# Patient Record
Sex: Female | Born: 1935 | Race: White | Hispanic: No | Smoking: Never smoker
Health system: Southern US, Community
[De-identification: ages and names within clinical notes are randomized; demographics above are authoritative.]

## PROBLEM LIST (undated history)

## (undated) DIAGNOSIS — F0390 Unspecified dementia without behavioral disturbance: Secondary | ICD-10-CM

## (undated) DIAGNOSIS — E079 Disorder of thyroid, unspecified: Secondary | ICD-10-CM

## (undated) HISTORY — PX: CHOLECYSTECTOMY: SHX55

---

## 2014-03-24 DIAGNOSIS — F411 Generalized anxiety disorder: Secondary | ICD-10-CM | POA: Diagnosis not present

## 2014-03-24 DIAGNOSIS — F0150 Vascular dementia without behavioral disturbance: Secondary | ICD-10-CM | POA: Diagnosis not present

## 2014-03-31 DIAGNOSIS — E782 Mixed hyperlipidemia: Secondary | ICD-10-CM | POA: Diagnosis not present

## 2014-03-31 DIAGNOSIS — I482 Chronic atrial fibrillation: Secondary | ICD-10-CM | POA: Diagnosis not present

## 2014-03-31 DIAGNOSIS — K219 Gastro-esophageal reflux disease without esophagitis: Secondary | ICD-10-CM | POA: Diagnosis not present

## 2014-03-31 DIAGNOSIS — F0150 Vascular dementia without behavioral disturbance: Secondary | ICD-10-CM | POA: Diagnosis not present

## 2014-03-31 DIAGNOSIS — I10 Essential (primary) hypertension: Secondary | ICD-10-CM | POA: Diagnosis not present

## 2014-03-31 DIAGNOSIS — E038 Other specified hypothyroidism: Secondary | ICD-10-CM | POA: Diagnosis not present

## 2014-04-02 DIAGNOSIS — N390 Urinary tract infection, site not specified: Secondary | ICD-10-CM | POA: Diagnosis not present

## 2014-04-07 DIAGNOSIS — F0150 Vascular dementia without behavioral disturbance: Secondary | ICD-10-CM | POA: Diagnosis not present

## 2014-04-07 DIAGNOSIS — F411 Generalized anxiety disorder: Secondary | ICD-10-CM | POA: Diagnosis not present

## 2014-04-16 DIAGNOSIS — F0150 Vascular dementia without behavioral disturbance: Secondary | ICD-10-CM | POA: Diagnosis not present

## 2014-04-16 DIAGNOSIS — F411 Generalized anxiety disorder: Secondary | ICD-10-CM | POA: Diagnosis not present

## 2014-04-21 DIAGNOSIS — F0150 Vascular dementia without behavioral disturbance: Secondary | ICD-10-CM | POA: Diagnosis not present

## 2014-04-21 DIAGNOSIS — F411 Generalized anxiety disorder: Secondary | ICD-10-CM | POA: Diagnosis not present

## 2014-04-27 DIAGNOSIS — G301 Alzheimer's disease with late onset: Secondary | ICD-10-CM | POA: Diagnosis not present

## 2014-04-27 DIAGNOSIS — I10 Essential (primary) hypertension: Secondary | ICD-10-CM | POA: Diagnosis not present

## 2014-04-27 DIAGNOSIS — I4891 Unspecified atrial fibrillation: Secondary | ICD-10-CM | POA: Diagnosis not present

## 2014-04-27 DIAGNOSIS — E039 Hypothyroidism, unspecified: Secondary | ICD-10-CM | POA: Diagnosis not present

## 2014-05-05 DIAGNOSIS — R51 Headache: Secondary | ICD-10-CM | POA: Diagnosis not present

## 2014-05-05 DIAGNOSIS — S0990XA Unspecified injury of head, initial encounter: Secondary | ICD-10-CM | POA: Diagnosis not present

## 2014-05-06 ENCOUNTER — Encounter (HOSPITAL_COMMUNITY): Payer: Self-pay

## 2014-05-06 ENCOUNTER — Emergency Department (HOSPITAL_COMMUNITY)
Admission: EM | Admit: 2014-05-06 | Discharge: 2014-05-06 | Disposition: A | Discharge status: Home / Self Care | Payer: Medicare Other | Attending: Emergency Medicine | Admitting: Emergency Medicine

## 2014-05-06 ENCOUNTER — Emergency Department (HOSPITAL_COMMUNITY): Payer: Medicare Other

## 2014-05-06 DIAGNOSIS — Y92128 Other place in nursing home as the place of occurrence of the external cause: Secondary | ICD-10-CM | POA: Diagnosis not present

## 2014-05-06 DIAGNOSIS — S299XXA Unspecified injury of thorax, initial encounter: Secondary | ICD-10-CM | POA: Diagnosis not present

## 2014-05-06 DIAGNOSIS — W01198A Fall on same level from slipping, tripping and stumbling with subsequent striking against other object, initial encounter: Secondary | ICD-10-CM | POA: Diagnosis not present

## 2014-05-06 DIAGNOSIS — F0390 Unspecified dementia without behavioral disturbance: Secondary | ICD-10-CM | POA: Insufficient documentation

## 2014-05-06 DIAGNOSIS — Y998 Other external cause status: Secondary | ICD-10-CM | POA: Diagnosis not present

## 2014-05-06 DIAGNOSIS — B9689 Other specified bacterial agents as the cause of diseases classified elsewhere: Secondary | ICD-10-CM | POA: Diagnosis not present

## 2014-05-06 DIAGNOSIS — Y9389 Activity, other specified: Secondary | ICD-10-CM | POA: Diagnosis not present

## 2014-05-06 DIAGNOSIS — R259 Unspecified abnormal involuntary movements: Secondary | ICD-10-CM | POA: Diagnosis not present

## 2014-05-06 DIAGNOSIS — S199XXA Unspecified injury of neck, initial encounter: Secondary | ICD-10-CM | POA: Diagnosis not present

## 2014-05-06 DIAGNOSIS — N390 Urinary tract infection, site not specified: Secondary | ICD-10-CM | POA: Diagnosis not present

## 2014-05-06 DIAGNOSIS — S0083XA Contusion of other part of head, initial encounter: Secondary | ICD-10-CM | POA: Diagnosis not present

## 2014-05-06 DIAGNOSIS — R55 Syncope and collapse: Secondary | ICD-10-CM | POA: Diagnosis present

## 2014-05-06 DIAGNOSIS — W19XXXA Unspecified fall, initial encounter: Secondary | ICD-10-CM

## 2014-05-06 DIAGNOSIS — S0003XA Contusion of scalp, initial encounter: Secondary | ICD-10-CM | POA: Diagnosis not present

## 2014-05-06 HISTORY — DX: Unspecified dementia, unspecified severity, without behavioral disturbance, psychotic disturbance, mood disturbance, and anxiety: F03.90

## 2014-05-06 HISTORY — DX: Disorder of thyroid, unspecified: E07.9

## 2014-05-06 LAB — URINALYSIS, ROUTINE W REFLEX MICROSCOPIC
Bilirubin Urine: NEGATIVE
Glucose, UA: NEGATIVE mg/dL
Hgb urine dipstick: NEGATIVE
Ketones, ur: NEGATIVE mg/dL
NITRITE: NEGATIVE
PROTEIN: NEGATIVE mg/dL
SPECIFIC GRAVITY, URINE: 1.009 (ref 1.005–1.030)
UROBILINOGEN UA: 0.2 mg/dL (ref 0.0–1.0)
pH: 5 (ref 5.0–8.0)

## 2014-05-06 LAB — BASIC METABOLIC PANEL
Anion gap: 9 (ref 5–15)
BUN: 13 mg/dL (ref 6–23)
CALCIUM: 9.3 mg/dL (ref 8.4–10.5)
CO2: 26 mmol/L (ref 19–32)
Chloride: 105 mmol/L (ref 96–112)
Creatinine, Ser: 0.89 mg/dL (ref 0.50–1.10)
GFR calc Af Amer: 66 mL/min — ABNORMAL LOW (ref >90)
GFR, EST NON AFRICAN AMERICAN: 57 mL/min — AB (ref >90)
GLUCOSE: 114 mg/dL — AB (ref 70–99)
Potassium: 3.9 mmol/L (ref 3.5–5.1)
SODIUM: 140 mmol/L (ref 135–145)

## 2014-05-06 LAB — CBC
HEMATOCRIT: 42.1 % (ref 36.0–46.0)
HEMOGLOBIN: 13.8 g/dL (ref 12.0–15.0)
MCH: 30.5 pg (ref 26.0–34.0)
MCHC: 32.8 g/dL (ref 30.0–36.0)
MCV: 92.9 fL (ref 78.0–100.0)
Platelets: 260 10*3/uL (ref 150–400)
RBC: 4.53 MIL/uL (ref 3.87–5.11)
RDW: 13.3 % (ref 11.5–15.5)
WBC: 7.1 10*3/uL (ref 4.0–10.5)

## 2014-05-06 LAB — CBG MONITORING, ED: Glucose-Capillary: 102 mg/dL — ABNORMAL HIGH (ref 70–99)

## 2014-05-06 LAB — URINE MICROSCOPIC-ADD ON

## 2014-05-06 LAB — PROTIME-INR
INR: 1.18 (ref 0.00–1.49)
Prothrombin Time: 15.1 seconds (ref 11.6–15.2)

## 2014-05-06 MED ORDER — CEPHALEXIN 250 MG PO CAPS
500.0000 mg | ORAL_CAPSULE | Freq: Once | ORAL | Status: AC
  Administered 2014-05-06: 500 mg via ORAL
  Filled 2014-05-06: qty 2

## 2014-05-06 MED ORDER — CEPHALEXIN 500 MG PO CAPS: 500.0000 mg | ORAL_CAPSULE | Freq: Two times a day (BID) | ORAL | Status: DC

## 2014-05-06 NOTE — Discharge Instructions (Signed)
Fall Prevention and Home Safety Ruth Bartlett, your CT scan did not show any significant injury. Your urine does show an infection, take antibiotics as prescribed. Follow-up with your primary care physician within 3 days for continued management. If symptoms worsen come back to emergency department immediately. Thank you. Falls cause injuries and can affect all age groups. It is possible to prevent falls.  HOW TO PREVENT FALLS  Wear shoes with rubber soles that do not have an opening for your toes.  Keep the inside and outside of your house well lit.  Use night lights throughout your home.  Remove clutter from floors.  Clean up floor spills.  Remove throw rugs or fasten them to the floor with carpet tape.  Do not place electrical cords across pathways.  Put grab bars by your tub, shower, and toilet. Do not use towel bars as grab bars.  Put handrails on both sides of the stairway. Fix loose handrails.  Do not climb on stools or stepladders, if possible.  Do not wax your floors.  Repair uneven or unsafe sidewalks, walkways, or stairs.  Keep items you use a lot within reach.  Be aware of pets.  Keep emergency numbers next to the telephone.  Put smoke detectors in your home and near bedrooms. Ask your doctor what other things you can do to prevent falls. Document Released: 12/31/2008 Document Revised: 09/05/2011 Document Reviewed: 06/06/2011 [facility] Patient Information 2015 [place], [place]. This information is not intended to replace advice given to you by your health care provider. Make sure you discuss any questions you have with your health care provider. Urinary Tract Infection A urinary tract infection (UTI) can occur any place along the urinary tract. The tract includes the kidneys, ureters, bladder, and urethra. A type of germ called bacteria often causes a UTI. UTIs are often helped with antibiotic medicine.  HOME CARE   If given, take antibiotics as told by your doctor.  Finish them even if you start to feel better.  Drink enough fluids to keep your pee (urine) clear or pale yellow.  Avoid tea, drinks with caffeine, and bubbly (carbonated) drinks.  Pee often. Avoid holding your pee in for a long time.  Pee before and after having sex (intercourse).  Wipe from front to back after you poop (bowel movement) if you are a woman. Use each tissue only once. GET HELP RIGHT AWAY IF:   You have back pain.  You have lower belly (abdominal) pain.  You have chills.  You feel sick to your stomach (nauseous).  You throw up (vomit).  Your burning or discomfort with peeing does not go away.  You have a fever.  Your symptoms are not better in 3 days. MAKE SURE YOU:   Understand these instructions.  Will watch your condition.  Will get help right away if you are not doing well or get worse. Document Released: 08/23/2007 Document Revised: 11/29/2011 Document Reviewed: 10/05/2011 [facility] Patient Information 2015 [place], [place]. This information is not intended to replace advice given to you by your health care provider. Make sure you discuss any questions you have with your health care provider.

## 2014-05-06 NOTE — ED Notes (Signed)
Phlebotomy at the bedside  

## 2014-05-06 NOTE — ED Notes (Signed)
Per EMS[place] [facility] [place]. Patient has dementia. Found sitting in chair, small hematoma noted in back of head. Doesn't recall how she obtained injury. VS with ems: bp 140/80, p 70, rr 18, cbg 103. Daughter present at the bedside.

## 2014-05-06 NOTE — ED Notes (Signed)
Dr. Oni at the bedside.  

## 2014-05-06 NOTE — ED Provider Notes (Signed)
CSN: [id number]     Arrival date & time 05/06/14  0002 History  This chart was scribed for Ruth CrumbleAdeleke Anandi Abramo, MD by Richarda Overlieichard Holland, ED Scribe. This patient was seen in room D30C/D30C and the patient's care was started 12:09 AM.     Chief Complaint  Patient presents with  . Fall   Level 5 Caveat - Dementia   HPI HPI Comments: Ruth Bartlett is a 81 y.o. female with a history of dementia who presents to the Emergency Department complaining of multiple falls that occurred at 10PM and 11PM. Her daughter in law states that the fall was unwitnessed and is unsure if pt hit her head. She states that pt is on ativan and was given a total of 1.0mg  earlier tonight after 2 doses of 0.5mg . She states that pt is on blood thinning medication. Pt reports no alleviating or modifying factors at this time. Daughter in law reports NKDA.   Past Medical History  Diagnosis Date  . Dementia    No past surgical history on file. No family history on file. History  Substance Use Topics  . Smoking status: Not on file  . Smokeless tobacco: Not on file  . Alcohol Use: Not on file   OB History    No data available     Review of Systems  Unable to perform ROS: Dementia  Musculoskeletal: Positive for myalgias.  All other systems reviewed and are negative.  Allergies  Review of patient's allergies indicates not on file.  Home Medications   Prior to Admission medications   Not on File   BP 148/67 mmHg  Pulse 56  Temp(Src) 97.8 F (36.6 C) (Oral)  Resp 18  SpO2 97% Physical Exam  Constitutional: She is oriented to person, place, and time. She appears well-developed and well-nourished. No distress.  HENT:  Head: Normocephalic and atraumatic.  Nose: Nose normal.  Mouth/Throat: Oropharynx is clear and moist. No oropharyngeal exudate.  Eyes: Conjunctivae and EOM are normal. Pupils are equal, round, and reactive to light. No scleral icterus.  Neck: Normal range of motion. Neck supple. No JVD present. No  tracheal deviation present. No thyromegaly present.  Cardiovascular: Normal rate, regular rhythm and normal heart sounds.  Exam reveals no gallop and no friction rub.   No murmur heard. Pulmonary/Chest: Effort normal and breath sounds normal. No respiratory distress. She has no wheezes. She exhibits no tenderness.  Abdominal: Soft. Bowel sounds are normal. She exhibits no distension and no mass. There is no tenderness. There is no rebound and no guarding.  Musculoskeletal: Normal range of motion. She exhibits no edema or tenderness.  3cm circumferential soft tissue hematoma to her occiput.   Lymphadenopathy:    She has no cervical adenopathy.  Neurological: She is alert and oriented to person, place, and time. No cranial nerve deficit. She exhibits normal muscle tone.  Normal strength and sensation in all extremities. Able to follow commands.   Skin: Skin is warm and dry. No rash noted. No erythema. No pallor.  Nursing note and vitals reviewed.   ED Course  Procedures   DIAGNOSTIC STUDIES: Oxygen Saturation is 97% on RA, normal by my interpretation.    COORDINATION OF CARE: 12:14 AM Discussed treatment plan with pt at bedside and pt agreed to plan.   Labs Review Labs Reviewed  BASIC METABOLIC PANEL - Abnormal; Notable for the following:    Glucose, Bld 114 (*)    GFR calc non Af Amer 57 (*)    GFR calc  Af Amer 66 (*)    All other components within normal limits  URINALYSIS, ROUTINE W REFLEX MICROSCOPIC - Abnormal; Notable for the following:    APPearance CLOUDY (*)    Leukocytes, UA LARGE (*)    All other components within normal limits  URINE MICROSCOPIC-ADD ON - Abnormal; Notable for the following:    Squamous Epithelial / LPF FEW (*)    Bacteria, UA MANY (*)    All other components within normal limits  CBG MONITORING, ED - Abnormal; Notable for the following:    Glucose-Capillary 102 (*)    All other components within normal limits  CBC  PROTIME-INR    Imaging  Review Dg Chest 2 View  05/06/2014   CLINICAL DATA:  Status post fall.  Shortness of breath.  EXAM: CHEST  2 VIEW  COMPARISON:  None.  FINDINGS: The lungs are clear. Mild cardiomegaly is seen. The aorta is ectatic. No pneumothorax or pleural effusion. No focal bony abnormality.  IMPRESSION: No acute disease.  Cardiomegaly and ectatic aorta.   Electronically Signed   By: Drusilla Kanner M.D.   On: 05/06/2014 01:09   Ct Head Wo Contrast  05/06/2014   CLINICAL DATA:  Fall.  Hematoma on the posterior aspect of the head.  EXAM: CT HEAD WITHOUT CONTRAST  CT CERVICAL SPINE WITHOUT CONTRAST  TECHNIQUE: Multidetector CT imaging of the head and cervical spine was performed following the standard protocol without intravenous contrast. Multiplanar CT image reconstructions of the cervical spine were also generated.  COMPARISON:  None.  FINDINGS: CT HEAD FINDINGS  Cortical atrophy and chronic microvascular ischemic change are identified. No acute intracranial abnormality including hemorrhage, infarct, mass lesion, mass effect, midline shift or abnormal extra-axial fluid collection is seen. The calvarium is intact. Scalp contusion posteriorly near the vertex is noted.  CT CERVICAL SPINE FINDINGS  No fracture or malalignment of the cervical spine is identified. Cervical spondylosis is seen and most notable at C4-5 and C5-6. Lung apices demonstrate scar, more notable on the right.  IMPRESSION: Scalp contusion without underlying fracture or acute intracranial abnormality.  No acute abnormality cervical spine.  Atrophy and chronic microvascular ischemic change  Cervical spondylosis.   Electronically Signed   By: Drusilla Kanner M.D.   On: 05/06/2014 01:06   Ct Cervical Spine Wo Contrast  05/06/2014   CLINICAL DATA:  Fall.  Hematoma on the posterior aspect of the head.  EXAM: CT HEAD WITHOUT CONTRAST  CT CERVICAL SPINE WITHOUT CONTRAST  TECHNIQUE: Multidetector CT imaging of the head and cervical spine was performed following  the standard protocol without intravenous contrast. Multiplanar CT image reconstructions of the cervical spine were also generated.  COMPARISON:  None.  FINDINGS: CT HEAD FINDINGS  Cortical atrophy and chronic microvascular ischemic change are identified. No acute intracranial abnormality including hemorrhage, infarct, mass lesion, mass effect, midline shift or abnormal extra-axial fluid collection is seen. The calvarium is intact. Scalp contusion posteriorly near the vertex is noted.  CT CERVICAL SPINE FINDINGS  No fracture or malalignment of the cervical spine is identified. Cervical spondylosis is seen and most notable at C4-5 and C5-6. Lung apices demonstrate scar, more notable on the right.  IMPRESSION: Scalp contusion without underlying fracture or acute intracranial abnormality.  No acute abnormality cervical spine.  Atrophy and chronic microvascular ischemic change  Cervical spondylosis.   Electronically Signed   By: Drusilla Kanner M.D.   On: 05/06/2014 01:06     EKG Interpretation None  MDM   Final diagnoses:  None   Patient presents emergency department after falling at her facility. By history this is likely due to being given too much Ativan tonight. Patient is on a blood thinner, will obtain CT scan of head for evaluation. No break in skin, no tetanus indicated. There is soft tissue swelling seen in the back of her head. Also will get laboratory studies and infections workup to ensure there is no other cause of her fall.  CT scan does not show any significant injury. Urinalysis does show an infection. Patient is otherwise hemodynamically stable. First dose Anaprox was given in emergency department, she'll be sent home with a prescription for complete course. Patient's vital signs were within her normal limits and she is safe for transfer back to her facility.   I personally performed the services described in this documentation, which was scribed in my presence. The recorded  information has been reviewed and is accurate.      Ruth Crumble, MD 05/06/14 [phone]

## 2014-05-06 NOTE — ED Notes (Signed)
[facility]

## 2014-05-13 DIAGNOSIS — N390 Urinary tract infection, site not specified: Secondary | ICD-10-CM | POA: Diagnosis not present

## 2014-05-14 DIAGNOSIS — Z9181 History of falling: Secondary | ICD-10-CM | POA: Diagnosis not present

## 2014-05-14 DIAGNOSIS — N390 Urinary tract infection, site not specified: Secondary | ICD-10-CM | POA: Diagnosis not present

## 2014-05-14 DIAGNOSIS — F0390 Unspecified dementia without behavioral disturbance: Secondary | ICD-10-CM | POA: Diagnosis not present

## 2014-05-14 DIAGNOSIS — R2689 Other abnormalities of gait and mobility: Secondary | ICD-10-CM | POA: Diagnosis not present

## 2014-05-18 DIAGNOSIS — F0390 Unspecified dementia without behavioral disturbance: Secondary | ICD-10-CM | POA: Diagnosis not present

## 2014-05-18 DIAGNOSIS — R2689 Other abnormalities of gait and mobility: Secondary | ICD-10-CM | POA: Diagnosis not present

## 2014-05-18 DIAGNOSIS — N390 Urinary tract infection, site not specified: Secondary | ICD-10-CM | POA: Diagnosis not present

## 2014-05-18 DIAGNOSIS — Z9181 History of falling: Secondary | ICD-10-CM | POA: Diagnosis not present

## 2014-05-21 DIAGNOSIS — Z9181 History of falling: Secondary | ICD-10-CM | POA: Diagnosis not present

## 2014-05-21 DIAGNOSIS — N390 Urinary tract infection, site not specified: Secondary | ICD-10-CM | POA: Diagnosis not present

## 2014-05-21 DIAGNOSIS — R2689 Other abnormalities of gait and mobility: Secondary | ICD-10-CM | POA: Diagnosis not present

## 2014-05-21 DIAGNOSIS — F0390 Unspecified dementia without behavioral disturbance: Secondary | ICD-10-CM | POA: Diagnosis not present

## 2014-05-25 DIAGNOSIS — R2689 Other abnormalities of gait and mobility: Secondary | ICD-10-CM | POA: Diagnosis not present

## 2014-05-25 DIAGNOSIS — Z9181 History of falling: Secondary | ICD-10-CM | POA: Diagnosis not present

## 2014-05-25 DIAGNOSIS — N390 Urinary tract infection, site not specified: Secondary | ICD-10-CM | POA: Diagnosis not present

## 2014-05-25 DIAGNOSIS — F0390 Unspecified dementia without behavioral disturbance: Secondary | ICD-10-CM | POA: Diagnosis not present

## 2014-05-26 DIAGNOSIS — N390 Urinary tract infection, site not specified: Secondary | ICD-10-CM | POA: Diagnosis not present

## 2014-05-28 DIAGNOSIS — Z9181 History of falling: Secondary | ICD-10-CM | POA: Diagnosis not present

## 2014-05-28 DIAGNOSIS — F0390 Unspecified dementia without behavioral disturbance: Secondary | ICD-10-CM | POA: Diagnosis not present

## 2014-05-28 DIAGNOSIS — R2689 Other abnormalities of gait and mobility: Secondary | ICD-10-CM | POA: Diagnosis not present

## 2014-05-28 DIAGNOSIS — N390 Urinary tract infection, site not specified: Secondary | ICD-10-CM | POA: Diagnosis not present

## 2014-06-01 DIAGNOSIS — R2689 Other abnormalities of gait and mobility: Secondary | ICD-10-CM | POA: Diagnosis not present

## 2014-06-01 DIAGNOSIS — Z9181 History of falling: Secondary | ICD-10-CM | POA: Diagnosis not present

## 2014-06-01 DIAGNOSIS — N390 Urinary tract infection, site not specified: Secondary | ICD-10-CM | POA: Diagnosis not present

## 2014-06-01 DIAGNOSIS — F0390 Unspecified dementia without behavioral disturbance: Secondary | ICD-10-CM | POA: Diagnosis not present

## 2014-06-04 DIAGNOSIS — R2689 Other abnormalities of gait and mobility: Secondary | ICD-10-CM | POA: Diagnosis not present

## 2014-06-04 DIAGNOSIS — F0390 Unspecified dementia without behavioral disturbance: Secondary | ICD-10-CM | POA: Diagnosis not present

## 2014-06-04 DIAGNOSIS — N390 Urinary tract infection, site not specified: Secondary | ICD-10-CM | POA: Diagnosis not present

## 2014-06-04 DIAGNOSIS — Z9181 History of falling: Secondary | ICD-10-CM | POA: Diagnosis not present

## 2014-06-09 DIAGNOSIS — N390 Urinary tract infection, site not specified: Secondary | ICD-10-CM | POA: Diagnosis not present

## 2014-06-09 DIAGNOSIS — Z9181 History of falling: Secondary | ICD-10-CM | POA: Diagnosis not present

## 2014-06-09 DIAGNOSIS — R2689 Other abnormalities of gait and mobility: Secondary | ICD-10-CM | POA: Diagnosis not present

## 2014-06-09 DIAGNOSIS — F0390 Unspecified dementia without behavioral disturbance: Secondary | ICD-10-CM | POA: Diagnosis not present

## 2014-06-11 DIAGNOSIS — F0390 Unspecified dementia without behavioral disturbance: Secondary | ICD-10-CM | POA: Diagnosis not present

## 2014-06-11 DIAGNOSIS — R2689 Other abnormalities of gait and mobility: Secondary | ICD-10-CM | POA: Diagnosis not present

## 2014-06-11 DIAGNOSIS — Z9181 History of falling: Secondary | ICD-10-CM | POA: Diagnosis not present

## 2014-06-11 DIAGNOSIS — N390 Urinary tract infection, site not specified: Secondary | ICD-10-CM | POA: Diagnosis not present

## 2014-06-15 DIAGNOSIS — R2689 Other abnormalities of gait and mobility: Secondary | ICD-10-CM | POA: Diagnosis not present

## 2014-06-15 DIAGNOSIS — Z9181 History of falling: Secondary | ICD-10-CM | POA: Diagnosis not present

## 2014-06-15 DIAGNOSIS — F0390 Unspecified dementia without behavioral disturbance: Secondary | ICD-10-CM | POA: Diagnosis not present

## 2014-06-15 DIAGNOSIS — N390 Urinary tract infection, site not specified: Secondary | ICD-10-CM | POA: Diagnosis not present

## 2014-06-16 DIAGNOSIS — N390 Urinary tract infection, site not specified: Secondary | ICD-10-CM | POA: Diagnosis not present

## 2014-06-16 DIAGNOSIS — R2689 Other abnormalities of gait and mobility: Secondary | ICD-10-CM | POA: Diagnosis not present

## 2014-06-16 DIAGNOSIS — Z9181 History of falling: Secondary | ICD-10-CM | POA: Diagnosis not present

## 2014-06-16 DIAGNOSIS — F0390 Unspecified dementia without behavioral disturbance: Secondary | ICD-10-CM | POA: Diagnosis not present

## 2014-06-17 ENCOUNTER — Emergency Department (HOSPITAL_COMMUNITY)
Admission: EM | Admit: 2014-06-17 | Discharge: 2014-06-17 | Disposition: A | Discharge status: Home / Self Care | Payer: Medicare Other | Attending: Emergency Medicine | Admitting: Emergency Medicine

## 2014-06-17 ENCOUNTER — Encounter (HOSPITAL_COMMUNITY): Payer: Self-pay | Admitting: *Deleted

## 2014-06-17 ENCOUNTER — Emergency Department (HOSPITAL_COMMUNITY): Payer: Medicare Other

## 2014-06-17 ENCOUNTER — Other Ambulatory Visit (HOSPITAL_COMMUNITY): Payer: Medicare Other

## 2014-06-17 DIAGNOSIS — Y998 Other external cause status: Secondary | ICD-10-CM | POA: Diagnosis not present

## 2014-06-17 DIAGNOSIS — Y9289 Other specified places as the place of occurrence of the external cause: Secondary | ICD-10-CM | POA: Insufficient documentation

## 2014-06-17 DIAGNOSIS — W010XXA Fall on same level from slipping, tripping and stumbling without subsequent striking against object, initial encounter: Secondary | ICD-10-CM | POA: Insufficient documentation

## 2014-06-17 DIAGNOSIS — Z792 Long term (current) use of antibiotics: Secondary | ICD-10-CM | POA: Insufficient documentation

## 2014-06-17 DIAGNOSIS — Z7901 Long term (current) use of anticoagulants: Secondary | ICD-10-CM | POA: Insufficient documentation

## 2014-06-17 DIAGNOSIS — E079 Disorder of thyroid, unspecified: Secondary | ICD-10-CM | POA: Insufficient documentation

## 2014-06-17 DIAGNOSIS — T148 Other injury of unspecified body region: Secondary | ICD-10-CM | POA: Diagnosis not present

## 2014-06-17 DIAGNOSIS — S0083XA Contusion of other part of head, initial encounter: Secondary | ICD-10-CM | POA: Diagnosis not present

## 2014-06-17 DIAGNOSIS — F0390 Unspecified dementia without behavioral disturbance: Secondary | ICD-10-CM | POA: Diagnosis not present

## 2014-06-17 DIAGNOSIS — M25569 Pain in unspecified knee: Secondary | ICD-10-CM | POA: Diagnosis not present

## 2014-06-17 DIAGNOSIS — S299XXA Unspecified injury of thorax, initial encounter: Secondary | ICD-10-CM | POA: Diagnosis not present

## 2014-06-17 DIAGNOSIS — S199XXA Unspecified injury of neck, initial encounter: Secondary | ICD-10-CM | POA: Diagnosis not present

## 2014-06-17 DIAGNOSIS — S0990XA Unspecified injury of head, initial encounter: Secondary | ICD-10-CM | POA: Diagnosis present

## 2014-06-17 DIAGNOSIS — W19XXXA Unspecified fall, initial encounter: Secondary | ICD-10-CM

## 2014-06-17 DIAGNOSIS — Y9389 Activity, other specified: Secondary | ICD-10-CM | POA: Diagnosis not present

## 2014-06-17 DIAGNOSIS — Z79899 Other long term (current) drug therapy: Secondary | ICD-10-CM | POA: Diagnosis not present

## 2014-06-17 LAB — CBC WITH DIFFERENTIAL/PLATELET
BASOS ABS: 0 10*3/uL (ref 0.0–0.1)
BASOS PCT: 0 % (ref 0–1)
EOS PCT: 3 % (ref 0–5)
Eosinophils Absolute: 0.2 10*3/uL (ref 0.0–0.7)
HEMATOCRIT: 41.6 % (ref 36.0–46.0)
Hemoglobin: 13.8 g/dL (ref 12.0–15.0)
Lymphocytes Relative: 21 % (ref 12–46)
Lymphs Abs: 1.9 10*3/uL (ref 0.7–4.0)
MCH: 31.1 pg (ref 26.0–34.0)
MCHC: 33.2 g/dL (ref 30.0–36.0)
MCV: 93.7 fL (ref 78.0–100.0)
MONO ABS: 0.6 10*3/uL (ref 0.1–1.0)
Monocytes Relative: 7 % (ref 3–12)
NEUTROS ABS: 6.2 10*3/uL (ref 1.7–7.7)
Neutrophils Relative %: 69 % (ref 43–77)
Platelets: 287 10*3/uL (ref 150–400)
RBC: 4.44 MIL/uL (ref 3.87–5.11)
RDW: 13.7 % (ref 11.5–15.5)
WBC: 8.9 10*3/uL (ref 4.0–10.5)

## 2014-06-17 LAB — BASIC METABOLIC PANEL
Anion gap: 8 (ref 5–15)
BUN: 18 mg/dL (ref 6–23)
CO2: 27 mmol/L (ref 19–32)
CREATININE: 0.84 mg/dL (ref 0.50–1.10)
Calcium: 9.4 mg/dL (ref 8.4–10.5)
Chloride: 105 mmol/L (ref 96–112)
GFR calc Af Amer: 70 mL/min — ABNORMAL LOW (ref >90)
GFR, EST NON AFRICAN AMERICAN: 60 mL/min — AB (ref >90)
Glucose, Bld: 107 mg/dL — ABNORMAL HIGH (ref 70–99)
POTASSIUM: 4 mmol/L (ref 3.5–5.1)
SODIUM: 140 mmol/L (ref 135–145)

## 2014-06-17 MED ORDER — KETOROLAC TROMETHAMINE 30 MG/ML IJ SOLN
15.0000 mg | Freq: Once | INTRAMUSCULAR | Status: DC
  Filled 2014-06-17: qty 1

## 2014-06-17 NOTE — ED Notes (Signed)
Bed: WA13 Expected date:  Expected time:  Means of arrival:  Comments: EMS-fall 

## 2014-06-17 NOTE — ED Notes (Signed)
Per EMS-tripped and fell, witnessed by staff-right eye, right knee abrasion-did not loose consciousness-uncertain if patient is on blood thinners

## 2014-06-17 NOTE — ED Notes (Signed)
Daughter Sedonia SmallSusan Sabey, [place]PO[place] #[phone]

## 2014-06-17 NOTE — ED Provider Notes (Signed)
CSN: [id number]     Arrival date & time 06/17/14  1140 History   First MD Initiated Contact with Patient 06/17/14 1200     Chief Complaint  Patient presents with  . Fall     (Consider location/radiation/quality/duration/timing/severity/associated sxs/prior Treatment) Patient is a 81 y.o. female presenting with fall.  Fall This is a new problem. The current episode started 1 to 2 hours ago. The problem occurs constantly. The problem has not changed since onset.Pertinent negatives include no chest pain, no abdominal pain, no headaches and no shortness of breath. Nothing aggravates the symptoms. Nothing relieves the symptoms.    Past Medical History  Diagnosis Date  . Dementia   . Thyroid disease    Past Surgical History  Procedure Laterality Date  . Cholecystectomy     No family history on file. History  Substance Use Topics  . Smoking status: Never Smoker   . Smokeless tobacco: Not on file  . Alcohol Use: No   OB History    No data available     Review of Systems  Respiratory: Negative for shortness of breath.   Cardiovascular: Negative for chest pain.  Gastrointestinal: Negative for abdominal pain.  Neurological: Negative for headaches.  All other systems reviewed and are negative.     Allergies  Ace inhibitors and Depakote  Home Medications   Prior to Admission medications   Medication Sig Start Date End Date Taking? Authorizing Provider  alum & mag hydroxide-simeth (MAALOX/MYLANTA) 200-200-20 MG/5ML suspension Take 10 mLs by mouth 3 (three) times daily as needed for indigestion or heartburn.    Historical Provider, MD  apixaban (ELIQUIS) 5 MG TABS tablet Take 5 mg by mouth 2 (two) times daily.    Historical Provider, MD  bismuth subsalicylate (PEPTO BISMOL) 262 MG/15ML suspension Take 15 mLs by mouth every 6 (six) hours as needed for diarrhea or loose stools.    Historical Provider, MD  cephALEXin (KEFLEX) 500 MG capsule Take 1 capsule (500 mg total) by mouth  2 (two) times daily. 05/06/14   Tomasita Crumble, MD  docusate sodium (COLACE) 100 MG capsule Take 100 mg by mouth daily.    Historical Provider, MD  donepezil (ARICEPT) 10 MG tablet Take 10 mg by mouth at bedtime.    Historical Provider, MD  guaiFENesin (MUCINEX) 600 MG 12 hr tablet Take 600 mg by mouth 2 (two) times daily as needed.    Historical Provider, MD  latanoprost (XALATAN) 0.005 % ophthalmic solution Place 1 drop into both eyes at bedtime.    Historical Provider, MD  levothyroxine (SYNTHROID, LEVOTHROID) 50 MCG tablet Take 50 mcg by mouth daily before breakfast.    Historical Provider, MD  LORazepam (ATIVAN) 0.5 MG tablet Take 0.5 mg by mouth 2 (two) times daily.    Historical Provider, MD  LORazepam (ATIVAN) 0.5 MG tablet Take 0.5 mg by mouth every 6 (six) hours as needed for anxiety.    Historical Provider, MD  memantine (NAMENDA) 10 MG tablet Take 10 mg by mouth 2 (two) times daily.    Historical Provider, MD  metoprolol succinate (TOPROL-XL) 25 MG 24 hr tablet Take 25 mg by mouth daily.    Historical Provider, MD  mirtazapine (REMERON) 15 MG tablet Take 7.5 mg by mouth at bedtime.    Historical Provider, MD  Multiple Vitamins-Minerals (CEROVITE PO) Take 1 tablet by mouth daily.    Historical Provider, MD  ranitidine (ZANTAC) 150 MG tablet Take 150 mg by mouth 2 (two) times daily.  Historical Provider, MD  sertraline (ZOLOFT) 100 MG tablet Take 100 mg by mouth daily.    Historical Provider, MD   BP 135/65 mmHg  Pulse 65  Temp(Src) 98.9 F (37.2 C) (Oral)  Resp 16  SpO2 95% Physical Exam  Constitutional: She is oriented to person, place, and time. She appears well-developed and well-nourished.  HENT:  Head: Normocephalic.  Right Ear: External ear normal.  Left Ear: External ear normal.  R eye hematoma over brow  Eyes: Conjunctivae and EOM are normal. Pupils are equal, round, and reactive to light.  Neck: Normal range of motion. Neck supple.  Cardiovascular: Normal rate, regular  rhythm, normal heart sounds and intact distal pulses.   Pulmonary/Chest: Effort normal and breath sounds normal.  Abdominal: Soft. Bowel sounds are normal. There is no tenderness.  Musculoskeletal: Normal range of motion.  Neurological: She is alert and oriented to person, place, and time.  Skin: Skin is warm and dry.  Vitals reviewed.   ED Course  Procedures (including critical care time) Labs Review Labs Reviewed  BASIC METABOLIC PANEL - Abnormal; Notable for the following:    Glucose, Bld 107 (*)    GFR calc non Af Amer 60 (*)    GFR calc Af Amer 70 (*)    All other components within normal limits  CBC WITH DIFFERENTIAL/PLATELET    Imaging Review Dg Chest 2 View  06/17/2014   CLINICAL DATA:  Fall going up stairs, dementia  EXAM: CHEST  2 VIEW  COMPARISON:  05/06/2014  FINDINGS: Cardiomediastinal silhouette is stable. No acute infiltrate or pulmonary edema. Osteopenia and mild degenerative changes again noted. Again noted ecstatic thoracic aorta. Old fracture deformity of right humeral head.  IMPRESSION: No active cardiopulmonary disease.   Electronically Signed   By: Natasha MeadLiviu  Pop M.D.   On: 06/17/2014 13:51   Ct Head Wo Contrast  06/17/2014   CLINICAL DATA:  Fall with right frontal hematoma.  Dementia.  EXAM: CT HEAD WITHOUT CONTRAST  CT CERVICAL SPINE WITHOUT CONTRAST  TECHNIQUE: Multidetector CT imaging of the head and cervical spine was performed following the standard protocol without intravenous contrast. Multiplanar CT image reconstructions of the cervical spine were also generated.  COMPARISON:  05/06/2014  FINDINGS: On scout imaging there is deformation of the right humeral head, likely from remote fracture.  CT HEAD FINDINGS  Skull and Sinuses:Right forehead contusion without fracture or foreign body.  Orbits: No acute abnormality.  Brain: No evidence of acute infarction, hemorrhage, hydrocephalus, or mass lesion/mass effect. Generalized cortical atrophy.  CT CERVICAL SPINE  FINDINGS  No evidence of cervical spine fracture or traumatic malalignment. No gross cervical canal hematoma or prevertebral edema.  Patient is status post C4-5 and C5-6 discectomy and fusion or ankylosis. There is diffuse degenerative disc and facet disease with multi level bulky facet spurring.  Stable biapical lung opacity, left greater than right, which on coronal imaging has bandlike morphology suggesting scar.  Right thyroidectomy.  IMPRESSION: 1. Right forehead contusion without fracture. 2. No acute intracranial injury or cervical spine fracture. 3. Right humeral neck fracture which appears remote. Correlate for acute shoulder pain. 4. Scar like opacities in the apical lungs.   Electronically Signed   By: Marnee SpringJonathon  Watts M.D.   On: 06/17/2014 13:54   Ct Cervical Spine Wo Contrast  06/17/2014   CLINICAL DATA:  Fall with right frontal hematoma.  Dementia.  EXAM: CT HEAD WITHOUT CONTRAST  CT CERVICAL SPINE WITHOUT CONTRAST  TECHNIQUE: Multidetector CT imaging of the  head and cervical spine was performed following the standard protocol without intravenous contrast. Multiplanar CT image reconstructions of the cervical spine were also generated.  COMPARISON:  05/06/2014  FINDINGS: On scout imaging there is deformation of the right humeral head, likely from remote fracture.  CT HEAD FINDINGS  Skull and Sinuses:Right forehead contusion without fracture or foreign body.  Orbits: No acute abnormality.  Brain: No evidence of acute infarction, hemorrhage, hydrocephalus, or mass lesion/mass effect. Generalized cortical atrophy.  CT CERVICAL SPINE FINDINGS  No evidence of cervical spine fracture or traumatic malalignment. No gross cervical canal hematoma or prevertebral edema.  Patient is status post C4-5 and C5-6 discectomy and fusion or ankylosis. There is diffuse degenerative disc and facet disease with multi level bulky facet spurring.  Stable biapical lung opacity, left greater than right, which on coronal imaging  has bandlike morphology suggesting scar.  Right thyroidectomy.  IMPRESSION: 1. Right forehead contusion without fracture. 2. No acute intracranial injury or cervical spine fracture. 3. Right humeral neck fracture which appears remote. Correlate for acute shoulder pain. 4. Scar like opacities in the apical lungs.   Electronically Signed   By: Marnee Spring M.D.   On: 06/17/2014 13:54     EKG Interpretation None      MDM   Final diagnoses:  Fall, initial encounter  Forehead contusion, initial encounter    81 y.o. female with pertinent PMH of dementia, afib on eliquis presents with mechanical fall.  Fall was witnessed, patient tripped over curb. She did hit her head. On arrival to the ED the patient has vital signs and physical exam as above. Per family she is at her baseline mental status.  This did involve defecating in the trash can, however we specifically discussed with the family and this is normal behavior for the patient.  Trauma as above.    Wu as above.  No acute traumatic injury.  Discussed potential UA with family.  With shared decision making agreed to defer as pt was at baseline and has clear mechanical etiology.      I have reviewed all laboratory and imaging studies if ordered as above  1. Fall, initial encounter   2. Forehead contusion, initial encounter         Mirian Mo, MD 06/17/14 [phone]

## 2014-06-17 NOTE — Discharge Instructions (Signed)
Fall Prevention and Home Safety °Falls cause injuries and can affect all age groups. It is possible to use preventive measures to significantly decrease the likelihood of falls. There are many simple measures which can make your home safer and prevent falls. °OUTDOORS °· Repair cracks and edges of walkways and driveways. °· Remove high doorway thresholds. °· Trim shrubbery on the main path into your home. °· Have good outside lighting. °· Clear walkways of tools, rocks, debris, and clutter. °· Check that handrails are not broken and are securely fastened. Both sides of steps should have handrails. °· Have leaves, snow, and ice cleared regularly. °· Use sand or salt on walkways during winter months. °· In the garage, clean up grease or oil spills. °BATHROOM °· Install night lights. °· Install grab bars by the toilet and in the tub and shower. °· Use non-skid mats or decals in the tub or shower. °· Place a plastic non-slip stool in the shower to sit on, if needed. °· Keep floors dry and clean up all water on the floor immediately. °· Remove soap buildup in the tub or shower on a regular basis. °· Secure bath mats with non-slip, double-sided rug tape. °· Remove throw rugs and tripping hazards from the floors. °BEDROOMS °· Install night lights. °· Make sure a bedside light is easy to reach. °· Do not use oversized bedding. °· Keep a telephone by your bedside. °· Have a firm chair with side arms to use for getting dressed. °· Remove throw rugs and tripping hazards from the floor. °KITCHEN °· Keep handles on pots and pans turned toward the center of the stove. Use back burners when possible. °· Clean up spills quickly and allow time for drying. °· Avoid walking on wet floors. °· Avoid hot utensils and knives. °· Position shelves so they are not too high or low. °· Place commonly used objects within easy reach. °· If necessary, use a sturdy step stool with a grab bar when reaching. °· Keep electrical cables out of the  way. °· Do not use floor polish or wax that makes floors slippery. If you must use wax, use non-skid floor wax. °· Remove throw rugs and tripping hazards from the floor. °STAIRWAYS °· Never leave objects on stairs. °· Place handrails on both sides of stairways and use them. Fix any loose handrails. Make sure handrails on both sides of the stairways are as long as the stairs. °· Check carpeting to make sure it is firmly attached along stairs. Make repairs to worn or loose carpet promptly. °· Avoid placing throw rugs at the top or bottom of stairways, or properly secure the rug with carpet tape to prevent slippage. Get rid of throw rugs, if possible. °· Have an electrician put in a light switch at the top and bottom of the stairs. °OTHER FALL PREVENTION TIPS °· Wear low-heel or rubber-soled shoes that are supportive and fit well. Wear closed toe shoes. °· When using a stepladder, make sure it is fully opened and both spreaders are firmly locked. Do not climb a closed stepladder. °· Add color or contrast paint or tape to grab bars and handrails in your home. Place contrasting color strips on first and last steps. °· Learn and use mobility aids as needed. Install an electrical emergency response system. °· Turn on lights to avoid dark areas. Replace light bulbs that burn out immediately. Get light switches that glow. °· Arrange furniture to create clear pathways. Keep furniture in the same place. °·   Firmly attach carpet with non-skid or double-sided tape. °· Eliminate uneven floor surfaces. °· Select a carpet pattern that does not visually hide the edge of steps. °· Be aware of all pets. °OTHER HOME SAFETY TIPS °· Set the water temperature for 120° F (48.8° C). °· Keep emergency numbers on or near the telephone. °· Keep smoke detectors on every level of the home and near sleeping areas. °Document Released: 02/24/2002 Document Revised: 09/05/2011 Document Reviewed: 05/26/2011 °Exit[employer]® Patient Information ©2015  [employer]. This information is not intended to replace advice given to you by your health care provider. Make sure you discuss any questions you have with your health care provider. ° ° °Head Injury °You have received a head injury. It does not appear serious at this time. Headaches and vomiting are common following head injury. It should be easy to awaken from sleeping. Sometimes it is necessary for you to stay in the emergency department for a while for observation. Sometimes admission to the hospital may be needed. After injuries such as yours, most problems occur within the first 24 hours, but side effects may occur up to 7-10 days after the injury. It is important for you to carefully monitor your condition and contact your health care provider or seek immediate medical care if there is a change in your condition. °WHAT ARE THE TYPES OF HEAD INJURIES? °Head injuries can be as minor as a bump. Some head injuries can be more severe. More severe head injuries include: °· A jarring injury to the brain (concussion). °· A bruise of the brain (contusion). This mean there is bleeding in the brain that can cause swelling. °· A cracked skull (skull fracture). °· Bleeding in the brain that collects, clots, and forms a bump (hematoma). °WHAT CAUSES A HEAD INJURY? °A serious head injury is most likely to happen to someone who is in a car wreck and is not wearing a seat belt. Other causes of major head injuries include bicycle or motorcycle accidents, sports injuries, and falls. °HOW ARE HEAD INJURIES DIAGNOSED? °A complete history of the event leading to the injury and your current symptoms will be helpful in diagnosing head injuries. Many times, pictures of the brain, such as CT or MRI are needed to see the extent of the injury. Often, an overnight hospital stay is necessary for observation.  °WHEN SHOULD I SEEK IMMEDIATE MEDICAL CARE?  °You should get help right away if: °· You have confusion or drowsiness. °· You  feel sick to your stomach (nauseous) or have continued, forceful vomiting. °· You have dizziness or unsteadiness that is getting worse. °· You have severe, continued headaches not relieved by medicine. Only take over-the-counter or prescription medicines for pain, fever, or discomfort as directed by your health care provider. °· You do not have normal function of the arms or legs or are unable to walk. °· You notice changes in the black spots in the center of the colored part of your eye (pupil). °· You have a clear or bloody fluid coming from your nose or ears. °· You have a loss of vision. °During the next 24 hours after the injury, you must stay with someone who can watch you for the warning signs. This person should contact local emergency services (911 in the [place] if you have seizures, you become unconscious, or you are unable to wake up. °HOW CAN I PREVENT A HEAD INJURY IN THE FUTURE? °The most important factor for preventing major head injuries is avoiding motor   vehicle accidents.  To minimize the potential for damage to your head, it is crucial to wear seat belts while riding in motor vehicles. Wearing helmets while bike riding and playing collision sports (like football) is also helpful. Also, avoiding dangerous activities around the house will further help reduce your risk of head injury.  °WHEN CAN I RETURN TO NORMAL ACTIVITIES AND ATHLETICS? °You should be reevaluated by your health care provider before returning to these activities. If you have any of the following symptoms, you should not return to activities or contact sports until 1 week after the symptoms have stopped: °· Persistent headache. °· Dizziness or vertigo. °· Poor attention and concentration. °· Confusion. °· Memory problems. °· Nausea or vomiting. °· Fatigue or tire easily. °· Irritability. °· Intolerant of bright lights or loud noises. °· Anxiety or depression. °· Disturbed sleep. °MAKE SURE YOU:  °· Understand these  instructions. °· Will watch your condition. °· Will get help right away if you are not doing well or get worse. °Document Released: 03/06/2005 Document Revised: 03/11/2013 Document Reviewed: 11/11/2012 °ExitCare® Patient Information ©2015 [employer]. This information is not intended to replace advice given to you by your health care provider. Make sure you discuss any questions you have with your health care provider. ° °

## 2014-06-22 DIAGNOSIS — Z9181 History of falling: Secondary | ICD-10-CM | POA: Diagnosis not present

## 2014-06-22 DIAGNOSIS — N390 Urinary tract infection, site not specified: Secondary | ICD-10-CM | POA: Diagnosis not present

## 2014-06-22 DIAGNOSIS — F0390 Unspecified dementia without behavioral disturbance: Secondary | ICD-10-CM | POA: Diagnosis not present

## 2014-06-22 DIAGNOSIS — R2689 Other abnormalities of gait and mobility: Secondary | ICD-10-CM | POA: Diagnosis not present

## 2014-06-26 DIAGNOSIS — F0390 Unspecified dementia without behavioral disturbance: Secondary | ICD-10-CM | POA: Diagnosis not present

## 2014-06-26 DIAGNOSIS — N390 Urinary tract infection, site not specified: Secondary | ICD-10-CM | POA: Diagnosis not present

## 2014-06-26 DIAGNOSIS — Z9181 History of falling: Secondary | ICD-10-CM | POA: Diagnosis not present

## 2014-06-26 DIAGNOSIS — R2689 Other abnormalities of gait and mobility: Secondary | ICD-10-CM | POA: Diagnosis not present

## 2014-06-29 DIAGNOSIS — F0390 Unspecified dementia without behavioral disturbance: Secondary | ICD-10-CM | POA: Diagnosis not present

## 2014-06-29 DIAGNOSIS — Z9181 History of falling: Secondary | ICD-10-CM | POA: Diagnosis not present

## 2014-06-29 DIAGNOSIS — N390 Urinary tract infection, site not specified: Secondary | ICD-10-CM | POA: Diagnosis not present

## 2014-06-29 DIAGNOSIS — R2689 Other abnormalities of gait and mobility: Secondary | ICD-10-CM | POA: Diagnosis not present

## 2014-07-03 DIAGNOSIS — Z9181 History of falling: Secondary | ICD-10-CM | POA: Diagnosis not present

## 2014-07-03 DIAGNOSIS — F0390 Unspecified dementia without behavioral disturbance: Secondary | ICD-10-CM | POA: Diagnosis not present

## 2014-07-03 DIAGNOSIS — N390 Urinary tract infection, site not specified: Secondary | ICD-10-CM | POA: Diagnosis not present

## 2014-07-03 DIAGNOSIS — R2689 Other abnormalities of gait and mobility: Secondary | ICD-10-CM | POA: Diagnosis not present

## 2014-07-06 DIAGNOSIS — R2689 Other abnormalities of gait and mobility: Secondary | ICD-10-CM | POA: Diagnosis not present

## 2014-07-06 DIAGNOSIS — N390 Urinary tract infection, site not specified: Secondary | ICD-10-CM | POA: Diagnosis not present

## 2014-07-06 DIAGNOSIS — F0390 Unspecified dementia without behavioral disturbance: Secondary | ICD-10-CM | POA: Diagnosis not present

## 2014-07-06 DIAGNOSIS — Z9181 History of falling: Secondary | ICD-10-CM | POA: Diagnosis not present

## 2014-07-07 DIAGNOSIS — F0390 Unspecified dementia without behavioral disturbance: Secondary | ICD-10-CM | POA: Diagnosis not present

## 2014-07-07 DIAGNOSIS — N390 Urinary tract infection, site not specified: Secondary | ICD-10-CM | POA: Diagnosis not present

## 2014-07-07 DIAGNOSIS — Z9181 History of falling: Secondary | ICD-10-CM | POA: Diagnosis not present

## 2014-07-07 DIAGNOSIS — R2689 Other abnormalities of gait and mobility: Secondary | ICD-10-CM | POA: Diagnosis not present

## 2014-08-19 DIAGNOSIS — N390 Urinary tract infection, site not specified: Secondary | ICD-10-CM | POA: Diagnosis not present

## 2014-08-28 DIAGNOSIS — I481 Persistent atrial fibrillation: Secondary | ICD-10-CM | POA: Diagnosis not present

## 2014-08-28 DIAGNOSIS — N183 Chronic kidney disease, stage 3 (moderate): Secondary | ICD-10-CM | POA: Diagnosis not present

## 2014-08-28 DIAGNOSIS — I129 Hypertensive chronic kidney disease with stage 1 through stage 4 chronic kidney disease, or unspecified chronic kidney disease: Secondary | ICD-10-CM | POA: Diagnosis not present

## 2014-08-28 DIAGNOSIS — I10 Essential (primary) hypertension: Secondary | ICD-10-CM | POA: Diagnosis not present

## 2014-08-28 DIAGNOSIS — N390 Urinary tract infection, site not specified: Secondary | ICD-10-CM | POA: Diagnosis not present

## 2014-08-28 DIAGNOSIS — G301 Alzheimer's disease with late onset: Secondary | ICD-10-CM | POA: Diagnosis not present

## 2014-08-28 DIAGNOSIS — Z79899 Other long term (current) drug therapy: Secondary | ICD-10-CM | POA: Diagnosis not present

## 2014-08-28 DIAGNOSIS — Z1389 Encounter for screening for other disorder: Secondary | ICD-10-CM | POA: Diagnosis not present

## 2014-10-15 DIAGNOSIS — N390 Urinary tract infection, site not specified: Secondary | ICD-10-CM | POA: Diagnosis not present

## 2014-12-04 DIAGNOSIS — G301 Alzheimer's disease with late onset: Secondary | ICD-10-CM | POA: Diagnosis not present

## 2014-12-04 DIAGNOSIS — F0391 Unspecified dementia with behavioral disturbance: Secondary | ICD-10-CM | POA: Diagnosis not present

## 2014-12-11 DIAGNOSIS — Z23 Encounter for immunization: Secondary | ICD-10-CM | POA: Diagnosis not present

## 2014-12-17 DIAGNOSIS — I4891 Unspecified atrial fibrillation: Secondary | ICD-10-CM | POA: Diagnosis not present

## 2014-12-17 DIAGNOSIS — F0391 Unspecified dementia with behavioral disturbance: Secondary | ICD-10-CM | POA: Diagnosis not present

## 2014-12-17 DIAGNOSIS — I10 Essential (primary) hypertension: Secondary | ICD-10-CM | POA: Diagnosis not present

## 2014-12-17 DIAGNOSIS — R1312 Dysphagia, oropharyngeal phase: Secondary | ICD-10-CM | POA: Diagnosis not present

## 2014-12-23 DIAGNOSIS — I481 Persistent atrial fibrillation: Secondary | ICD-10-CM | POA: Diagnosis not present

## 2014-12-23 DIAGNOSIS — G301 Alzheimer's disease with late onset: Secondary | ICD-10-CM | POA: Diagnosis not present

## 2014-12-23 DIAGNOSIS — E039 Hypothyroidism, unspecified: Secondary | ICD-10-CM | POA: Diagnosis not present

## 2014-12-23 DIAGNOSIS — N183 Chronic kidney disease, stage 3 (moderate): Secondary | ICD-10-CM | POA: Diagnosis not present

## 2014-12-23 DIAGNOSIS — I129 Hypertensive chronic kidney disease with stage 1 through stage 4 chronic kidney disease, or unspecified chronic kidney disease: Secondary | ICD-10-CM | POA: Diagnosis not present

## 2014-12-24 DIAGNOSIS — I10 Essential (primary) hypertension: Secondary | ICD-10-CM | POA: Diagnosis not present

## 2014-12-24 DIAGNOSIS — R1312 Dysphagia, oropharyngeal phase: Secondary | ICD-10-CM | POA: Diagnosis not present

## 2014-12-24 DIAGNOSIS — I4891 Unspecified atrial fibrillation: Secondary | ICD-10-CM | POA: Diagnosis not present

## 2014-12-24 DIAGNOSIS — F0391 Unspecified dementia with behavioral disturbance: Secondary | ICD-10-CM | POA: Diagnosis not present

## 2014-12-25 DIAGNOSIS — F0391 Unspecified dementia with behavioral disturbance: Secondary | ICD-10-CM | POA: Diagnosis not present

## 2014-12-25 DIAGNOSIS — R1312 Dysphagia, oropharyngeal phase: Secondary | ICD-10-CM | POA: Diagnosis not present

## 2014-12-25 DIAGNOSIS — I10 Essential (primary) hypertension: Secondary | ICD-10-CM | POA: Diagnosis not present

## 2014-12-25 DIAGNOSIS — I4891 Unspecified atrial fibrillation: Secondary | ICD-10-CM | POA: Diagnosis not present

## 2014-12-29 DIAGNOSIS — I10 Essential (primary) hypertension: Secondary | ICD-10-CM | POA: Diagnosis not present

## 2014-12-29 DIAGNOSIS — I4891 Unspecified atrial fibrillation: Secondary | ICD-10-CM | POA: Diagnosis not present

## 2014-12-29 DIAGNOSIS — F0391 Unspecified dementia with behavioral disturbance: Secondary | ICD-10-CM | POA: Diagnosis not present

## 2014-12-29 DIAGNOSIS — R1312 Dysphagia, oropharyngeal phase: Secondary | ICD-10-CM | POA: Diagnosis not present

## 2014-12-31 DIAGNOSIS — I10 Essential (primary) hypertension: Secondary | ICD-10-CM | POA: Diagnosis not present

## 2014-12-31 DIAGNOSIS — F0391 Unspecified dementia with behavioral disturbance: Secondary | ICD-10-CM | POA: Diagnosis not present

## 2014-12-31 DIAGNOSIS — I4891 Unspecified atrial fibrillation: Secondary | ICD-10-CM | POA: Diagnosis not present

## 2014-12-31 DIAGNOSIS — R1312 Dysphagia, oropharyngeal phase: Secondary | ICD-10-CM | POA: Diagnosis not present

## 2015-01-05 DIAGNOSIS — R1312 Dysphagia, oropharyngeal phase: Secondary | ICD-10-CM | POA: Diagnosis not present

## 2015-01-05 DIAGNOSIS — F0391 Unspecified dementia with behavioral disturbance: Secondary | ICD-10-CM | POA: Diagnosis not present

## 2015-01-05 DIAGNOSIS — I4891 Unspecified atrial fibrillation: Secondary | ICD-10-CM | POA: Diagnosis not present

## 2015-01-05 DIAGNOSIS — I10 Essential (primary) hypertension: Secondary | ICD-10-CM | POA: Diagnosis not present

## 2015-01-07 DIAGNOSIS — F0391 Unspecified dementia with behavioral disturbance: Secondary | ICD-10-CM | POA: Diagnosis not present

## 2015-01-07 DIAGNOSIS — I10 Essential (primary) hypertension: Secondary | ICD-10-CM | POA: Diagnosis not present

## 2015-01-07 DIAGNOSIS — R1312 Dysphagia, oropharyngeal phase: Secondary | ICD-10-CM | POA: Diagnosis not present

## 2015-01-07 DIAGNOSIS — I4891 Unspecified atrial fibrillation: Secondary | ICD-10-CM | POA: Diagnosis not present

## 2015-01-13 DIAGNOSIS — F0391 Unspecified dementia with behavioral disturbance: Secondary | ICD-10-CM | POA: Diagnosis not present

## 2015-01-13 DIAGNOSIS — I10 Essential (primary) hypertension: Secondary | ICD-10-CM | POA: Diagnosis not present

## 2015-01-13 DIAGNOSIS — R1312 Dysphagia, oropharyngeal phase: Secondary | ICD-10-CM | POA: Diagnosis not present

## 2015-01-13 DIAGNOSIS — I4891 Unspecified atrial fibrillation: Secondary | ICD-10-CM | POA: Diagnosis not present

## 2015-01-19 DIAGNOSIS — I10 Essential (primary) hypertension: Secondary | ICD-10-CM | POA: Diagnosis not present

## 2015-01-19 DIAGNOSIS — I4891 Unspecified atrial fibrillation: Secondary | ICD-10-CM | POA: Diagnosis not present

## 2015-01-19 DIAGNOSIS — R1312 Dysphagia, oropharyngeal phase: Secondary | ICD-10-CM | POA: Diagnosis not present

## 2015-01-19 DIAGNOSIS — F0391 Unspecified dementia with behavioral disturbance: Secondary | ICD-10-CM | POA: Diagnosis not present

## 2015-01-21 DIAGNOSIS — I10 Essential (primary) hypertension: Secondary | ICD-10-CM | POA: Diagnosis not present

## 2015-01-21 DIAGNOSIS — R1312 Dysphagia, oropharyngeal phase: Secondary | ICD-10-CM | POA: Diagnosis not present

## 2015-01-21 DIAGNOSIS — F0391 Unspecified dementia with behavioral disturbance: Secondary | ICD-10-CM | POA: Diagnosis not present

## 2015-01-21 DIAGNOSIS — I4891 Unspecified atrial fibrillation: Secondary | ICD-10-CM | POA: Diagnosis not present

## 2015-01-26 DIAGNOSIS — I10 Essential (primary) hypertension: Secondary | ICD-10-CM | POA: Diagnosis not present

## 2015-01-26 DIAGNOSIS — F0391 Unspecified dementia with behavioral disturbance: Secondary | ICD-10-CM | POA: Diagnosis not present

## 2015-01-26 DIAGNOSIS — R1312 Dysphagia, oropharyngeal phase: Secondary | ICD-10-CM | POA: Diagnosis not present

## 2015-01-26 DIAGNOSIS — I4891 Unspecified atrial fibrillation: Secondary | ICD-10-CM | POA: Diagnosis not present

## 2015-02-01 DIAGNOSIS — I4891 Unspecified atrial fibrillation: Secondary | ICD-10-CM | POA: Diagnosis not present

## 2015-02-01 DIAGNOSIS — I10 Essential (primary) hypertension: Secondary | ICD-10-CM | POA: Diagnosis not present

## 2015-02-01 DIAGNOSIS — R1312 Dysphagia, oropharyngeal phase: Secondary | ICD-10-CM | POA: Diagnosis not present

## 2015-02-01 DIAGNOSIS — F0391 Unspecified dementia with behavioral disturbance: Secondary | ICD-10-CM | POA: Diagnosis not present

## 2015-02-18 DIAGNOSIS — R350 Frequency of micturition: Secondary | ICD-10-CM | POA: Diagnosis not present

## 2015-02-19 DIAGNOSIS — M2011 Hallux valgus (acquired), right foot: Secondary | ICD-10-CM | POA: Diagnosis not present

## 2015-02-19 DIAGNOSIS — M2012 Hallux valgus (acquired), left foot: Secondary | ICD-10-CM | POA: Diagnosis not present

## 2015-03-25 DIAGNOSIS — N390 Urinary tract infection, site not specified: Secondary | ICD-10-CM | POA: Diagnosis not present

## 2015-05-14 DIAGNOSIS — G301 Alzheimer's disease with late onset: Secondary | ICD-10-CM | POA: Diagnosis not present

## 2015-05-14 DIAGNOSIS — F0391 Unspecified dementia with behavioral disturbance: Secondary | ICD-10-CM | POA: Diagnosis not present

## 2015-05-18 DIAGNOSIS — F411 Generalized anxiety disorder: Secondary | ICD-10-CM | POA: Diagnosis not present

## 2015-05-18 DIAGNOSIS — F0150 Vascular dementia without behavioral disturbance: Secondary | ICD-10-CM | POA: Diagnosis not present

## 2015-06-22 DIAGNOSIS — N183 Chronic kidney disease, stage 3 (moderate): Secondary | ICD-10-CM | POA: Diagnosis not present

## 2015-06-22 DIAGNOSIS — Z1389 Encounter for screening for other disorder: Secondary | ICD-10-CM | POA: Diagnosis not present

## 2015-06-22 DIAGNOSIS — Z79899 Other long term (current) drug therapy: Secondary | ICD-10-CM | POA: Diagnosis not present

## 2015-06-22 DIAGNOSIS — F0150 Vascular dementia without behavioral disturbance: Secondary | ICD-10-CM | POA: Diagnosis not present

## 2015-06-22 DIAGNOSIS — I129 Hypertensive chronic kidney disease with stage 1 through stage 4 chronic kidney disease, or unspecified chronic kidney disease: Secondary | ICD-10-CM | POA: Diagnosis not present

## 2015-06-22 DIAGNOSIS — Z0000 Encounter for general adult medical examination without abnormal findings: Secondary | ICD-10-CM | POA: Diagnosis not present

## 2015-06-22 DIAGNOSIS — I480 Paroxysmal atrial fibrillation: Secondary | ICD-10-CM | POA: Diagnosis not present

## 2015-06-22 DIAGNOSIS — G301 Alzheimer's disease with late onset: Secondary | ICD-10-CM | POA: Diagnosis not present

## 2015-06-22 DIAGNOSIS — Z23 Encounter for immunization: Secondary | ICD-10-CM | POA: Diagnosis not present

## 2015-06-22 DIAGNOSIS — F411 Generalized anxiety disorder: Secondary | ICD-10-CM | POA: Diagnosis not present

## 2015-06-22 DIAGNOSIS — E039 Hypothyroidism, unspecified: Secondary | ICD-10-CM | POA: Diagnosis not present

## 2015-06-25 DIAGNOSIS — F0150 Vascular dementia without behavioral disturbance: Secondary | ICD-10-CM | POA: Diagnosis not present

## 2015-06-25 DIAGNOSIS — M2010 Hallux valgus (acquired), unspecified foot: Secondary | ICD-10-CM | POA: Diagnosis not present

## 2015-06-25 DIAGNOSIS — I8390 Asymptomatic varicose veins of unspecified lower extremity: Secondary | ICD-10-CM | POA: Diagnosis not present

## 2015-07-20 DIAGNOSIS — F411 Generalized anxiety disorder: Secondary | ICD-10-CM | POA: Diagnosis not present

## 2015-07-20 DIAGNOSIS — F0150 Vascular dementia without behavioral disturbance: Secondary | ICD-10-CM | POA: Diagnosis not present

## 2015-07-23 DIAGNOSIS — E039 Hypothyroidism, unspecified: Secondary | ICD-10-CM | POA: Diagnosis not present

## 2015-07-23 DIAGNOSIS — F0150 Vascular dementia without behavioral disturbance: Secondary | ICD-10-CM | POA: Diagnosis not present

## 2015-08-10 DIAGNOSIS — N390 Urinary tract infection, site not specified: Secondary | ICD-10-CM | POA: Diagnosis not present

## 2015-08-24 DIAGNOSIS — F411 Generalized anxiety disorder: Secondary | ICD-10-CM | POA: Diagnosis not present

## 2015-08-24 DIAGNOSIS — F0150 Vascular dementia without behavioral disturbance: Secondary | ICD-10-CM | POA: Diagnosis not present

## 2015-11-19 DIAGNOSIS — R627 Adult failure to thrive: Secondary | ICD-10-CM | POA: Diagnosis not present

## 2015-11-20 DIAGNOSIS — N390 Urinary tract infection, site not specified: Secondary | ICD-10-CM | POA: Diagnosis not present

## 2015-12-02 DIAGNOSIS — Z23 Encounter for immunization: Secondary | ICD-10-CM | POA: Diagnosis not present

## 2015-12-22 DIAGNOSIS — R7301 Impaired fasting glucose: Secondary | ICD-10-CM | POA: Diagnosis not present

## 2015-12-22 DIAGNOSIS — I480 Paroxysmal atrial fibrillation: Secondary | ICD-10-CM | POA: Diagnosis not present

## 2015-12-22 DIAGNOSIS — E039 Hypothyroidism, unspecified: Secondary | ICD-10-CM | POA: Diagnosis not present

## 2015-12-22 DIAGNOSIS — I129 Hypertensive chronic kidney disease with stage 1 through stage 4 chronic kidney disease, or unspecified chronic kidney disease: Secondary | ICD-10-CM | POA: Diagnosis not present

## 2015-12-22 DIAGNOSIS — Z79899 Other long term (current) drug therapy: Secondary | ICD-10-CM | POA: Diagnosis not present

## 2015-12-22 DIAGNOSIS — G301 Alzheimer's disease with late onset: Secondary | ICD-10-CM | POA: Diagnosis not present

## 2015-12-22 DIAGNOSIS — N3000 Acute cystitis without hematuria: Secondary | ICD-10-CM | POA: Diagnosis not present

## 2015-12-22 DIAGNOSIS — N183 Chronic kidney disease, stage 3 (moderate): Secondary | ICD-10-CM | POA: Diagnosis not present

## 2015-12-30 DIAGNOSIS — R531 Weakness: Secondary | ICD-10-CM | POA: Diagnosis not present

## 2015-12-31 DIAGNOSIS — M2010 Hallux valgus (acquired), unspecified foot: Secondary | ICD-10-CM | POA: Diagnosis not present

## 2015-12-31 DIAGNOSIS — F0150 Vascular dementia without behavioral disturbance: Secondary | ICD-10-CM | POA: Diagnosis not present

## 2015-12-31 DIAGNOSIS — I8390 Asymptomatic varicose veins of unspecified lower extremity: Secondary | ICD-10-CM | POA: Diagnosis not present

## 2016-01-18 DIAGNOSIS — F0390 Unspecified dementia without behavioral disturbance: Secondary | ICD-10-CM | POA: Diagnosis not present

## 2016-01-18 DIAGNOSIS — I4891 Unspecified atrial fibrillation: Secondary | ICD-10-CM | POA: Diagnosis not present

## 2016-01-18 DIAGNOSIS — R2689 Other abnormalities of gait and mobility: Secondary | ICD-10-CM | POA: Diagnosis not present

## 2016-01-18 DIAGNOSIS — Z9181 History of falling: Secondary | ICD-10-CM | POA: Diagnosis not present

## 2016-01-18 DIAGNOSIS — I10 Essential (primary) hypertension: Secondary | ICD-10-CM | POA: Diagnosis not present

## 2016-01-18 DIAGNOSIS — R296 Repeated falls: Secondary | ICD-10-CM | POA: Diagnosis not present

## 2016-01-20 DIAGNOSIS — R2689 Other abnormalities of gait and mobility: Secondary | ICD-10-CM | POA: Diagnosis not present

## 2016-01-20 DIAGNOSIS — R296 Repeated falls: Secondary | ICD-10-CM | POA: Diagnosis not present

## 2016-01-20 DIAGNOSIS — I4891 Unspecified atrial fibrillation: Secondary | ICD-10-CM | POA: Diagnosis not present

## 2016-01-20 DIAGNOSIS — I10 Essential (primary) hypertension: Secondary | ICD-10-CM | POA: Diagnosis not present

## 2016-01-20 DIAGNOSIS — F0390 Unspecified dementia without behavioral disturbance: Secondary | ICD-10-CM | POA: Diagnosis not present

## 2016-01-20 DIAGNOSIS — Z9181 History of falling: Secondary | ICD-10-CM | POA: Diagnosis not present

## 2016-01-24 DIAGNOSIS — I4891 Unspecified atrial fibrillation: Secondary | ICD-10-CM | POA: Diagnosis not present

## 2016-01-24 DIAGNOSIS — Z9181 History of falling: Secondary | ICD-10-CM | POA: Diagnosis not present

## 2016-01-24 DIAGNOSIS — R296 Repeated falls: Secondary | ICD-10-CM | POA: Diagnosis not present

## 2016-01-24 DIAGNOSIS — I10 Essential (primary) hypertension: Secondary | ICD-10-CM | POA: Diagnosis not present

## 2016-01-24 DIAGNOSIS — R2689 Other abnormalities of gait and mobility: Secondary | ICD-10-CM | POA: Diagnosis not present

## 2016-01-24 DIAGNOSIS — R627 Adult failure to thrive: Secondary | ICD-10-CM | POA: Diagnosis not present

## 2016-01-24 DIAGNOSIS — F0390 Unspecified dementia without behavioral disturbance: Secondary | ICD-10-CM | POA: Diagnosis not present

## 2016-01-25 DIAGNOSIS — R1310 Dysphagia, unspecified: Secondary | ICD-10-CM | POA: Diagnosis not present

## 2016-01-25 DIAGNOSIS — R630 Anorexia: Secondary | ICD-10-CM | POA: Diagnosis not present

## 2016-01-25 DIAGNOSIS — K219 Gastro-esophageal reflux disease without esophagitis: Secondary | ICD-10-CM | POA: Diagnosis not present

## 2016-01-25 DIAGNOSIS — R2689 Other abnormalities of gait and mobility: Secondary | ICD-10-CM | POA: Diagnosis not present

## 2016-01-25 DIAGNOSIS — H409 Unspecified glaucoma: Secondary | ICD-10-CM | POA: Diagnosis not present

## 2016-01-25 DIAGNOSIS — R296 Repeated falls: Secondary | ICD-10-CM | POA: Diagnosis not present

## 2016-01-25 DIAGNOSIS — G309 Alzheimer's disease, unspecified: Secondary | ICD-10-CM | POA: Diagnosis not present

## 2016-01-25 DIAGNOSIS — E039 Hypothyroidism, unspecified: Secondary | ICD-10-CM | POA: Diagnosis not present

## 2016-01-25 DIAGNOSIS — I10 Essential (primary) hypertension: Secondary | ICD-10-CM | POA: Diagnosis not present

## 2016-01-25 DIAGNOSIS — F0281 Dementia in other diseases classified elsewhere with behavioral disturbance: Secondary | ICD-10-CM | POA: Diagnosis not present

## 2016-01-25 DIAGNOSIS — Z9181 History of falling: Secondary | ICD-10-CM | POA: Diagnosis not present

## 2016-01-25 DIAGNOSIS — I4891 Unspecified atrial fibrillation: Secondary | ICD-10-CM | POA: Diagnosis not present

## 2016-01-25 DIAGNOSIS — F0390 Unspecified dementia without behavioral disturbance: Secondary | ICD-10-CM | POA: Diagnosis not present

## 2016-01-25 DIAGNOSIS — R634 Abnormal weight loss: Secondary | ICD-10-CM | POA: Diagnosis not present

## 2016-01-27 DIAGNOSIS — F0281 Dementia in other diseases classified elsewhere with behavioral disturbance: Secondary | ICD-10-CM | POA: Diagnosis not present

## 2016-01-27 DIAGNOSIS — I4891 Unspecified atrial fibrillation: Secondary | ICD-10-CM | POA: Diagnosis not present

## 2016-01-27 DIAGNOSIS — I10 Essential (primary) hypertension: Secondary | ICD-10-CM | POA: Diagnosis not present

## 2016-01-27 DIAGNOSIS — R1310 Dysphagia, unspecified: Secondary | ICD-10-CM | POA: Diagnosis not present

## 2016-01-27 DIAGNOSIS — G309 Alzheimer's disease, unspecified: Secondary | ICD-10-CM | POA: Diagnosis not present

## 2016-01-27 DIAGNOSIS — K219 Gastro-esophageal reflux disease without esophagitis: Secondary | ICD-10-CM | POA: Diagnosis not present

## 2016-01-31 DIAGNOSIS — I4891 Unspecified atrial fibrillation: Secondary | ICD-10-CM | POA: Diagnosis not present

## 2016-01-31 DIAGNOSIS — R1310 Dysphagia, unspecified: Secondary | ICD-10-CM | POA: Diagnosis not present

## 2016-01-31 DIAGNOSIS — F0281 Dementia in other diseases classified elsewhere with behavioral disturbance: Secondary | ICD-10-CM | POA: Diagnosis not present

## 2016-01-31 DIAGNOSIS — K219 Gastro-esophageal reflux disease without esophagitis: Secondary | ICD-10-CM | POA: Diagnosis not present

## 2016-01-31 DIAGNOSIS — I10 Essential (primary) hypertension: Secondary | ICD-10-CM | POA: Diagnosis not present

## 2016-01-31 DIAGNOSIS — G309 Alzheimer's disease, unspecified: Secondary | ICD-10-CM | POA: Diagnosis not present

## 2016-02-02 DIAGNOSIS — I4891 Unspecified atrial fibrillation: Secondary | ICD-10-CM | POA: Diagnosis not present

## 2016-02-02 DIAGNOSIS — G309 Alzheimer's disease, unspecified: Secondary | ICD-10-CM | POA: Diagnosis not present

## 2016-02-02 DIAGNOSIS — K219 Gastro-esophageal reflux disease without esophagitis: Secondary | ICD-10-CM | POA: Diagnosis not present

## 2016-02-02 DIAGNOSIS — I10 Essential (primary) hypertension: Secondary | ICD-10-CM | POA: Diagnosis not present

## 2016-02-02 DIAGNOSIS — F0281 Dementia in other diseases classified elsewhere with behavioral disturbance: Secondary | ICD-10-CM | POA: Diagnosis not present

## 2016-02-02 DIAGNOSIS — R1310 Dysphagia, unspecified: Secondary | ICD-10-CM | POA: Diagnosis not present

## 2016-02-03 DIAGNOSIS — I4891 Unspecified atrial fibrillation: Secondary | ICD-10-CM | POA: Diagnosis not present

## 2016-02-03 DIAGNOSIS — I10 Essential (primary) hypertension: Secondary | ICD-10-CM | POA: Diagnosis not present

## 2016-02-03 DIAGNOSIS — F0281 Dementia in other diseases classified elsewhere with behavioral disturbance: Secondary | ICD-10-CM | POA: Diagnosis not present

## 2016-02-03 DIAGNOSIS — G309 Alzheimer's disease, unspecified: Secondary | ICD-10-CM | POA: Diagnosis not present

## 2016-02-03 DIAGNOSIS — K219 Gastro-esophageal reflux disease without esophagitis: Secondary | ICD-10-CM | POA: Diagnosis not present

## 2016-02-03 DIAGNOSIS — R1310 Dysphagia, unspecified: Secondary | ICD-10-CM | POA: Diagnosis not present

## 2016-02-06 DIAGNOSIS — I4891 Unspecified atrial fibrillation: Secondary | ICD-10-CM | POA: Diagnosis not present

## 2016-02-06 DIAGNOSIS — I10 Essential (primary) hypertension: Secondary | ICD-10-CM | POA: Diagnosis not present

## 2016-02-06 DIAGNOSIS — G309 Alzheimer's disease, unspecified: Secondary | ICD-10-CM | POA: Diagnosis not present

## 2016-02-06 DIAGNOSIS — K219 Gastro-esophageal reflux disease without esophagitis: Secondary | ICD-10-CM | POA: Diagnosis not present

## 2016-02-06 DIAGNOSIS — F0281 Dementia in other diseases classified elsewhere with behavioral disturbance: Secondary | ICD-10-CM | POA: Diagnosis not present

## 2016-02-06 DIAGNOSIS — R1310 Dysphagia, unspecified: Secondary | ICD-10-CM | POA: Diagnosis not present

## 2016-02-07 DIAGNOSIS — F0281 Dementia in other diseases classified elsewhere with behavioral disturbance: Secondary | ICD-10-CM | POA: Diagnosis not present

## 2016-02-07 DIAGNOSIS — G309 Alzheimer's disease, unspecified: Secondary | ICD-10-CM | POA: Diagnosis not present

## 2016-02-07 DIAGNOSIS — I10 Essential (primary) hypertension: Secondary | ICD-10-CM | POA: Diagnosis not present

## 2016-02-07 DIAGNOSIS — R1310 Dysphagia, unspecified: Secondary | ICD-10-CM | POA: Diagnosis not present

## 2016-02-07 DIAGNOSIS — I4891 Unspecified atrial fibrillation: Secondary | ICD-10-CM | POA: Diagnosis not present

## 2016-02-07 DIAGNOSIS — K219 Gastro-esophageal reflux disease without esophagitis: Secondary | ICD-10-CM | POA: Diagnosis not present

## 2016-02-08 DIAGNOSIS — G309 Alzheimer's disease, unspecified: Secondary | ICD-10-CM | POA: Diagnosis not present

## 2016-02-08 DIAGNOSIS — I4891 Unspecified atrial fibrillation: Secondary | ICD-10-CM | POA: Diagnosis not present

## 2016-02-08 DIAGNOSIS — F0281 Dementia in other diseases classified elsewhere with behavioral disturbance: Secondary | ICD-10-CM | POA: Diagnosis not present

## 2016-02-08 DIAGNOSIS — R1310 Dysphagia, unspecified: Secondary | ICD-10-CM | POA: Diagnosis not present

## 2016-02-08 DIAGNOSIS — K219 Gastro-esophageal reflux disease without esophagitis: Secondary | ICD-10-CM | POA: Diagnosis not present

## 2016-02-08 DIAGNOSIS — I10 Essential (primary) hypertension: Secondary | ICD-10-CM | POA: Diagnosis not present

## 2016-02-09 DIAGNOSIS — K219 Gastro-esophageal reflux disease without esophagitis: Secondary | ICD-10-CM | POA: Diagnosis not present

## 2016-02-09 DIAGNOSIS — G309 Alzheimer's disease, unspecified: Secondary | ICD-10-CM | POA: Diagnosis not present

## 2016-02-09 DIAGNOSIS — I4891 Unspecified atrial fibrillation: Secondary | ICD-10-CM | POA: Diagnosis not present

## 2016-02-09 DIAGNOSIS — I10 Essential (primary) hypertension: Secondary | ICD-10-CM | POA: Diagnosis not present

## 2016-02-09 DIAGNOSIS — F0281 Dementia in other diseases classified elsewhere with behavioral disturbance: Secondary | ICD-10-CM | POA: Diagnosis not present

## 2016-02-09 DIAGNOSIS — R1310 Dysphagia, unspecified: Secondary | ICD-10-CM | POA: Diagnosis not present

## 2016-02-11 DIAGNOSIS — I4891 Unspecified atrial fibrillation: Secondary | ICD-10-CM | POA: Diagnosis not present

## 2016-02-11 DIAGNOSIS — G309 Alzheimer's disease, unspecified: Secondary | ICD-10-CM | POA: Diagnosis not present

## 2016-02-11 DIAGNOSIS — R1310 Dysphagia, unspecified: Secondary | ICD-10-CM | POA: Diagnosis not present

## 2016-02-11 DIAGNOSIS — I10 Essential (primary) hypertension: Secondary | ICD-10-CM | POA: Diagnosis not present

## 2016-02-11 DIAGNOSIS — F0281 Dementia in other diseases classified elsewhere with behavioral disturbance: Secondary | ICD-10-CM | POA: Diagnosis not present

## 2016-02-11 DIAGNOSIS — K219 Gastro-esophageal reflux disease without esophagitis: Secondary | ICD-10-CM | POA: Diagnosis not present

## 2016-02-13 DIAGNOSIS — G309 Alzheimer's disease, unspecified: Secondary | ICD-10-CM | POA: Diagnosis not present

## 2016-02-13 DIAGNOSIS — I4891 Unspecified atrial fibrillation: Secondary | ICD-10-CM | POA: Diagnosis not present

## 2016-02-13 DIAGNOSIS — R1310 Dysphagia, unspecified: Secondary | ICD-10-CM | POA: Diagnosis not present

## 2016-02-13 DIAGNOSIS — F0281 Dementia in other diseases classified elsewhere with behavioral disturbance: Secondary | ICD-10-CM | POA: Diagnosis not present

## 2016-02-13 DIAGNOSIS — K219 Gastro-esophageal reflux disease without esophagitis: Secondary | ICD-10-CM | POA: Diagnosis not present

## 2016-02-13 DIAGNOSIS — I10 Essential (primary) hypertension: Secondary | ICD-10-CM | POA: Diagnosis not present

## 2016-02-14 DIAGNOSIS — K219 Gastro-esophageal reflux disease without esophagitis: Secondary | ICD-10-CM | POA: Diagnosis not present

## 2016-02-14 DIAGNOSIS — I4891 Unspecified atrial fibrillation: Secondary | ICD-10-CM | POA: Diagnosis not present

## 2016-02-14 DIAGNOSIS — R1310 Dysphagia, unspecified: Secondary | ICD-10-CM | POA: Diagnosis not present

## 2016-02-14 DIAGNOSIS — I10 Essential (primary) hypertension: Secondary | ICD-10-CM | POA: Diagnosis not present

## 2016-02-14 DIAGNOSIS — F0281 Dementia in other diseases classified elsewhere with behavioral disturbance: Secondary | ICD-10-CM | POA: Diagnosis not present

## 2016-02-14 DIAGNOSIS — G309 Alzheimer's disease, unspecified: Secondary | ICD-10-CM | POA: Diagnosis not present

## 2016-02-15 DIAGNOSIS — G309 Alzheimer's disease, unspecified: Secondary | ICD-10-CM | POA: Diagnosis not present

## 2016-02-15 DIAGNOSIS — F0281 Dementia in other diseases classified elsewhere with behavioral disturbance: Secondary | ICD-10-CM | POA: Diagnosis not present

## 2016-02-15 DIAGNOSIS — I10 Essential (primary) hypertension: Secondary | ICD-10-CM | POA: Diagnosis not present

## 2016-02-15 DIAGNOSIS — I4891 Unspecified atrial fibrillation: Secondary | ICD-10-CM | POA: Diagnosis not present

## 2016-02-15 DIAGNOSIS — K219 Gastro-esophageal reflux disease without esophagitis: Secondary | ICD-10-CM | POA: Diagnosis not present

## 2016-02-15 DIAGNOSIS — R1310 Dysphagia, unspecified: Secondary | ICD-10-CM | POA: Diagnosis not present

## 2016-02-16 DIAGNOSIS — I10 Essential (primary) hypertension: Secondary | ICD-10-CM | POA: Diagnosis not present

## 2016-02-16 DIAGNOSIS — R1310 Dysphagia, unspecified: Secondary | ICD-10-CM | POA: Diagnosis not present

## 2016-02-16 DIAGNOSIS — K219 Gastro-esophageal reflux disease without esophagitis: Secondary | ICD-10-CM | POA: Diagnosis not present

## 2016-02-16 DIAGNOSIS — F0281 Dementia in other diseases classified elsewhere with behavioral disturbance: Secondary | ICD-10-CM | POA: Diagnosis not present

## 2016-02-16 DIAGNOSIS — I4891 Unspecified atrial fibrillation: Secondary | ICD-10-CM | POA: Diagnosis not present

## 2016-02-16 DIAGNOSIS — G309 Alzheimer's disease, unspecified: Secondary | ICD-10-CM | POA: Diagnosis not present

## 2016-02-18 DIAGNOSIS — I4891 Unspecified atrial fibrillation: Secondary | ICD-10-CM | POA: Diagnosis not present

## 2016-02-18 DIAGNOSIS — R1310 Dysphagia, unspecified: Secondary | ICD-10-CM | POA: Diagnosis not present

## 2016-02-18 DIAGNOSIS — E039 Hypothyroidism, unspecified: Secondary | ICD-10-CM | POA: Diagnosis not present

## 2016-02-18 DIAGNOSIS — R634 Abnormal weight loss: Secondary | ICD-10-CM | POA: Diagnosis not present

## 2016-02-18 DIAGNOSIS — R630 Anorexia: Secondary | ICD-10-CM | POA: Diagnosis not present

## 2016-02-18 DIAGNOSIS — G309 Alzheimer's disease, unspecified: Secondary | ICD-10-CM | POA: Diagnosis not present

## 2016-02-18 DIAGNOSIS — I10 Essential (primary) hypertension: Secondary | ICD-10-CM | POA: Diagnosis not present

## 2016-02-18 DIAGNOSIS — H409 Unspecified glaucoma: Secondary | ICD-10-CM | POA: Diagnosis not present

## 2016-02-18 DIAGNOSIS — K219 Gastro-esophageal reflux disease without esophagitis: Secondary | ICD-10-CM | POA: Diagnosis not present

## 2016-02-18 DIAGNOSIS — F0281 Dementia in other diseases classified elsewhere with behavioral disturbance: Secondary | ICD-10-CM | POA: Diagnosis not present

## 2016-02-20 DIAGNOSIS — F0281 Dementia in other diseases classified elsewhere with behavioral disturbance: Secondary | ICD-10-CM | POA: Diagnosis not present

## 2016-02-20 DIAGNOSIS — I4891 Unspecified atrial fibrillation: Secondary | ICD-10-CM | POA: Diagnosis not present

## 2016-02-20 DIAGNOSIS — R1310 Dysphagia, unspecified: Secondary | ICD-10-CM | POA: Diagnosis not present

## 2016-02-20 DIAGNOSIS — I10 Essential (primary) hypertension: Secondary | ICD-10-CM | POA: Diagnosis not present

## 2016-02-20 DIAGNOSIS — K219 Gastro-esophageal reflux disease without esophagitis: Secondary | ICD-10-CM | POA: Diagnosis not present

## 2016-02-20 DIAGNOSIS — G309 Alzheimer's disease, unspecified: Secondary | ICD-10-CM | POA: Diagnosis not present

## 2016-02-21 DIAGNOSIS — K219 Gastro-esophageal reflux disease without esophagitis: Secondary | ICD-10-CM | POA: Diagnosis not present

## 2016-02-21 DIAGNOSIS — F0281 Dementia in other diseases classified elsewhere with behavioral disturbance: Secondary | ICD-10-CM | POA: Diagnosis not present

## 2016-02-21 DIAGNOSIS — I10 Essential (primary) hypertension: Secondary | ICD-10-CM | POA: Diagnosis not present

## 2016-02-21 DIAGNOSIS — G309 Alzheimer's disease, unspecified: Secondary | ICD-10-CM | POA: Diagnosis not present

## 2016-02-21 DIAGNOSIS — R1310 Dysphagia, unspecified: Secondary | ICD-10-CM | POA: Diagnosis not present

## 2016-02-21 DIAGNOSIS — I4891 Unspecified atrial fibrillation: Secondary | ICD-10-CM | POA: Diagnosis not present

## 2016-02-22 DIAGNOSIS — K219 Gastro-esophageal reflux disease without esophagitis: Secondary | ICD-10-CM | POA: Diagnosis not present

## 2016-02-22 DIAGNOSIS — I4891 Unspecified atrial fibrillation: Secondary | ICD-10-CM | POA: Diagnosis not present

## 2016-02-22 DIAGNOSIS — G309 Alzheimer's disease, unspecified: Secondary | ICD-10-CM | POA: Diagnosis not present

## 2016-02-22 DIAGNOSIS — F0281 Dementia in other diseases classified elsewhere with behavioral disturbance: Secondary | ICD-10-CM | POA: Diagnosis not present

## 2016-02-22 DIAGNOSIS — R1310 Dysphagia, unspecified: Secondary | ICD-10-CM | POA: Diagnosis not present

## 2016-02-22 DIAGNOSIS — I10 Essential (primary) hypertension: Secondary | ICD-10-CM | POA: Diagnosis not present

## 2016-02-23 DIAGNOSIS — F0281 Dementia in other diseases classified elsewhere with behavioral disturbance: Secondary | ICD-10-CM | POA: Diagnosis not present

## 2016-02-23 DIAGNOSIS — K219 Gastro-esophageal reflux disease without esophagitis: Secondary | ICD-10-CM | POA: Diagnosis not present

## 2016-02-23 DIAGNOSIS — I4891 Unspecified atrial fibrillation: Secondary | ICD-10-CM | POA: Diagnosis not present

## 2016-02-23 DIAGNOSIS — R1310 Dysphagia, unspecified: Secondary | ICD-10-CM | POA: Diagnosis not present

## 2016-02-23 DIAGNOSIS — G309 Alzheimer's disease, unspecified: Secondary | ICD-10-CM | POA: Diagnosis not present

## 2016-02-23 DIAGNOSIS — I10 Essential (primary) hypertension: Secondary | ICD-10-CM | POA: Diagnosis not present

## 2016-02-24 DIAGNOSIS — F0281 Dementia in other diseases classified elsewhere with behavioral disturbance: Secondary | ICD-10-CM | POA: Diagnosis not present

## 2016-02-24 DIAGNOSIS — I10 Essential (primary) hypertension: Secondary | ICD-10-CM | POA: Diagnosis not present

## 2016-02-24 DIAGNOSIS — K219 Gastro-esophageal reflux disease without esophagitis: Secondary | ICD-10-CM | POA: Diagnosis not present

## 2016-02-24 DIAGNOSIS — G309 Alzheimer's disease, unspecified: Secondary | ICD-10-CM | POA: Diagnosis not present

## 2016-02-24 DIAGNOSIS — I4891 Unspecified atrial fibrillation: Secondary | ICD-10-CM | POA: Diagnosis not present

## 2016-02-24 DIAGNOSIS — R1310 Dysphagia, unspecified: Secondary | ICD-10-CM | POA: Diagnosis not present

## 2016-02-25 DIAGNOSIS — R1310 Dysphagia, unspecified: Secondary | ICD-10-CM | POA: Diagnosis not present

## 2016-02-25 DIAGNOSIS — I10 Essential (primary) hypertension: Secondary | ICD-10-CM | POA: Diagnosis not present

## 2016-02-25 DIAGNOSIS — F0281 Dementia in other diseases classified elsewhere with behavioral disturbance: Secondary | ICD-10-CM | POA: Diagnosis not present

## 2016-02-25 DIAGNOSIS — K219 Gastro-esophageal reflux disease without esophagitis: Secondary | ICD-10-CM | POA: Diagnosis not present

## 2016-02-25 DIAGNOSIS — I4891 Unspecified atrial fibrillation: Secondary | ICD-10-CM | POA: Diagnosis not present

## 2016-02-25 DIAGNOSIS — G309 Alzheimer's disease, unspecified: Secondary | ICD-10-CM | POA: Diagnosis not present

## 2016-02-27 ENCOUNTER — Other Ambulatory Visit
Admission: RE | Admit: 2016-02-27 | Discharge: 2016-02-27 | Disposition: A | Discharge status: Home / Self Care | Payer: Medicare Other | Source: Ambulatory Visit | Attending: Geriatric Medicine | Admitting: Geriatric Medicine

## 2016-02-27 DIAGNOSIS — N390 Urinary tract infection, site not specified: Secondary | ICD-10-CM | POA: Diagnosis not present

## 2016-02-27 LAB — URINALYSIS, COMPLETE (UACMP) WITH MICROSCOPIC
Bilirubin Urine: NEGATIVE
Glucose, UA: NEGATIVE mg/dL
Hgb urine dipstick: NEGATIVE
KETONES UR: NEGATIVE mg/dL
Nitrite: NEGATIVE
PH: 5 (ref 5.0–8.0)
Protein, ur: 30 mg/dL — AB
SPECIFIC GRAVITY, URINE: 1.019 (ref 1.005–1.030)

## 2016-02-28 DIAGNOSIS — F0281 Dementia in other diseases classified elsewhere with behavioral disturbance: Secondary | ICD-10-CM | POA: Diagnosis not present

## 2016-02-28 DIAGNOSIS — K219 Gastro-esophageal reflux disease without esophagitis: Secondary | ICD-10-CM | POA: Diagnosis not present

## 2016-02-28 DIAGNOSIS — R1310 Dysphagia, unspecified: Secondary | ICD-10-CM | POA: Diagnosis not present

## 2016-02-28 DIAGNOSIS — I10 Essential (primary) hypertension: Secondary | ICD-10-CM | POA: Diagnosis not present

## 2016-02-28 DIAGNOSIS — I4891 Unspecified atrial fibrillation: Secondary | ICD-10-CM | POA: Diagnosis not present

## 2016-02-28 DIAGNOSIS — G309 Alzheimer's disease, unspecified: Secondary | ICD-10-CM | POA: Diagnosis not present

## 2016-02-29 DIAGNOSIS — K219 Gastro-esophageal reflux disease without esophagitis: Secondary | ICD-10-CM | POA: Diagnosis not present

## 2016-02-29 DIAGNOSIS — G309 Alzheimer's disease, unspecified: Secondary | ICD-10-CM | POA: Diagnosis not present

## 2016-02-29 DIAGNOSIS — R1310 Dysphagia, unspecified: Secondary | ICD-10-CM | POA: Diagnosis not present

## 2016-02-29 DIAGNOSIS — F0281 Dementia in other diseases classified elsewhere with behavioral disturbance: Secondary | ICD-10-CM | POA: Diagnosis not present

## 2016-02-29 DIAGNOSIS — I10 Essential (primary) hypertension: Secondary | ICD-10-CM | POA: Diagnosis not present

## 2016-02-29 DIAGNOSIS — I4891 Unspecified atrial fibrillation: Secondary | ICD-10-CM | POA: Diagnosis not present

## 2016-03-02 DIAGNOSIS — F0281 Dementia in other diseases classified elsewhere with behavioral disturbance: Secondary | ICD-10-CM | POA: Diagnosis not present

## 2016-03-02 DIAGNOSIS — I4891 Unspecified atrial fibrillation: Secondary | ICD-10-CM | POA: Diagnosis not present

## 2016-03-02 DIAGNOSIS — I10 Essential (primary) hypertension: Secondary | ICD-10-CM | POA: Diagnosis not present

## 2016-03-02 DIAGNOSIS — R1310 Dysphagia, unspecified: Secondary | ICD-10-CM | POA: Diagnosis not present

## 2016-03-02 DIAGNOSIS — G309 Alzheimer's disease, unspecified: Secondary | ICD-10-CM | POA: Diagnosis not present

## 2016-03-02 DIAGNOSIS — K219 Gastro-esophageal reflux disease without esophagitis: Secondary | ICD-10-CM | POA: Diagnosis not present

## 2016-03-03 DIAGNOSIS — K219 Gastro-esophageal reflux disease without esophagitis: Secondary | ICD-10-CM | POA: Diagnosis not present

## 2016-03-03 DIAGNOSIS — F0281 Dementia in other diseases classified elsewhere with behavioral disturbance: Secondary | ICD-10-CM | POA: Diagnosis not present

## 2016-03-03 DIAGNOSIS — I10 Essential (primary) hypertension: Secondary | ICD-10-CM | POA: Diagnosis not present

## 2016-03-03 DIAGNOSIS — G309 Alzheimer's disease, unspecified: Secondary | ICD-10-CM | POA: Diagnosis not present

## 2016-03-03 DIAGNOSIS — I4891 Unspecified atrial fibrillation: Secondary | ICD-10-CM | POA: Diagnosis not present

## 2016-03-03 DIAGNOSIS — R1310 Dysphagia, unspecified: Secondary | ICD-10-CM | POA: Diagnosis not present

## 2016-03-06 DIAGNOSIS — R1310 Dysphagia, unspecified: Secondary | ICD-10-CM | POA: Diagnosis not present

## 2016-03-06 DIAGNOSIS — I10 Essential (primary) hypertension: Secondary | ICD-10-CM | POA: Diagnosis not present

## 2016-03-06 DIAGNOSIS — K219 Gastro-esophageal reflux disease without esophagitis: Secondary | ICD-10-CM | POA: Diagnosis not present

## 2016-03-06 DIAGNOSIS — F0281 Dementia in other diseases classified elsewhere with behavioral disturbance: Secondary | ICD-10-CM | POA: Diagnosis not present

## 2016-03-06 DIAGNOSIS — I4891 Unspecified atrial fibrillation: Secondary | ICD-10-CM | POA: Diagnosis not present

## 2016-03-06 DIAGNOSIS — G309 Alzheimer's disease, unspecified: Secondary | ICD-10-CM | POA: Diagnosis not present

## 2016-03-07 DIAGNOSIS — I4891 Unspecified atrial fibrillation: Secondary | ICD-10-CM | POA: Diagnosis not present

## 2016-03-07 DIAGNOSIS — F0281 Dementia in other diseases classified elsewhere with behavioral disturbance: Secondary | ICD-10-CM | POA: Diagnosis not present

## 2016-03-07 DIAGNOSIS — K219 Gastro-esophageal reflux disease without esophagitis: Secondary | ICD-10-CM | POA: Diagnosis not present

## 2016-03-07 DIAGNOSIS — I10 Essential (primary) hypertension: Secondary | ICD-10-CM | POA: Diagnosis not present

## 2016-03-07 DIAGNOSIS — R1310 Dysphagia, unspecified: Secondary | ICD-10-CM | POA: Diagnosis not present

## 2016-03-07 DIAGNOSIS — G309 Alzheimer's disease, unspecified: Secondary | ICD-10-CM | POA: Diagnosis not present

## 2016-03-08 DIAGNOSIS — I4891 Unspecified atrial fibrillation: Secondary | ICD-10-CM | POA: Diagnosis not present

## 2016-03-08 DIAGNOSIS — F0281 Dementia in other diseases classified elsewhere with behavioral disturbance: Secondary | ICD-10-CM | POA: Diagnosis not present

## 2016-03-08 DIAGNOSIS — G309 Alzheimer's disease, unspecified: Secondary | ICD-10-CM | POA: Diagnosis not present

## 2016-03-08 DIAGNOSIS — K219 Gastro-esophageal reflux disease without esophagitis: Secondary | ICD-10-CM | POA: Diagnosis not present

## 2016-03-08 DIAGNOSIS — I10 Essential (primary) hypertension: Secondary | ICD-10-CM | POA: Diagnosis not present

## 2016-03-08 DIAGNOSIS — R1310 Dysphagia, unspecified: Secondary | ICD-10-CM | POA: Diagnosis not present

## 2016-03-09 DIAGNOSIS — K219 Gastro-esophageal reflux disease without esophagitis: Secondary | ICD-10-CM | POA: Diagnosis not present

## 2016-03-09 DIAGNOSIS — R1310 Dysphagia, unspecified: Secondary | ICD-10-CM | POA: Diagnosis not present

## 2016-03-09 DIAGNOSIS — F0281 Dementia in other diseases classified elsewhere with behavioral disturbance: Secondary | ICD-10-CM | POA: Diagnosis not present

## 2016-03-09 DIAGNOSIS — G309 Alzheimer's disease, unspecified: Secondary | ICD-10-CM | POA: Diagnosis not present

## 2016-03-09 DIAGNOSIS — I4891 Unspecified atrial fibrillation: Secondary | ICD-10-CM | POA: Diagnosis not present

## 2016-03-09 DIAGNOSIS — I10 Essential (primary) hypertension: Secondary | ICD-10-CM | POA: Diagnosis not present

## 2016-03-10 DIAGNOSIS — I4891 Unspecified atrial fibrillation: Secondary | ICD-10-CM | POA: Diagnosis not present

## 2016-03-10 DIAGNOSIS — I10 Essential (primary) hypertension: Secondary | ICD-10-CM | POA: Diagnosis not present

## 2016-03-10 DIAGNOSIS — G309 Alzheimer's disease, unspecified: Secondary | ICD-10-CM | POA: Diagnosis not present

## 2016-03-10 DIAGNOSIS — K219 Gastro-esophageal reflux disease without esophagitis: Secondary | ICD-10-CM | POA: Diagnosis not present

## 2016-03-10 DIAGNOSIS — R1310 Dysphagia, unspecified: Secondary | ICD-10-CM | POA: Diagnosis not present

## 2016-03-10 DIAGNOSIS — F0281 Dementia in other diseases classified elsewhere with behavioral disturbance: Secondary | ICD-10-CM | POA: Diagnosis not present

## 2016-03-14 DIAGNOSIS — F0281 Dementia in other diseases classified elsewhere with behavioral disturbance: Secondary | ICD-10-CM | POA: Diagnosis not present

## 2016-03-14 DIAGNOSIS — R1310 Dysphagia, unspecified: Secondary | ICD-10-CM | POA: Diagnosis not present

## 2016-03-14 DIAGNOSIS — K219 Gastro-esophageal reflux disease without esophagitis: Secondary | ICD-10-CM | POA: Diagnosis not present

## 2016-03-14 DIAGNOSIS — G309 Alzheimer's disease, unspecified: Secondary | ICD-10-CM | POA: Diagnosis not present

## 2016-03-14 DIAGNOSIS — I4891 Unspecified atrial fibrillation: Secondary | ICD-10-CM | POA: Diagnosis not present

## 2016-03-14 DIAGNOSIS — I10 Essential (primary) hypertension: Secondary | ICD-10-CM | POA: Diagnosis not present

## 2016-03-16 DIAGNOSIS — I4891 Unspecified atrial fibrillation: Secondary | ICD-10-CM | POA: Diagnosis not present

## 2016-03-16 DIAGNOSIS — G309 Alzheimer's disease, unspecified: Secondary | ICD-10-CM | POA: Diagnosis not present

## 2016-03-16 DIAGNOSIS — I10 Essential (primary) hypertension: Secondary | ICD-10-CM | POA: Diagnosis not present

## 2016-03-16 DIAGNOSIS — R1310 Dysphagia, unspecified: Secondary | ICD-10-CM | POA: Diagnosis not present

## 2016-03-16 DIAGNOSIS — F0281 Dementia in other diseases classified elsewhere with behavioral disturbance: Secondary | ICD-10-CM | POA: Diagnosis not present

## 2016-03-16 DIAGNOSIS — K219 Gastro-esophageal reflux disease without esophagitis: Secondary | ICD-10-CM | POA: Diagnosis not present

## 2016-03-17 DIAGNOSIS — F0281 Dementia in other diseases classified elsewhere with behavioral disturbance: Secondary | ICD-10-CM | POA: Diagnosis not present

## 2016-03-17 DIAGNOSIS — G309 Alzheimer's disease, unspecified: Secondary | ICD-10-CM | POA: Diagnosis not present

## 2016-03-17 DIAGNOSIS — R1310 Dysphagia, unspecified: Secondary | ICD-10-CM | POA: Diagnosis not present

## 2016-03-17 DIAGNOSIS — K219 Gastro-esophageal reflux disease without esophagitis: Secondary | ICD-10-CM | POA: Diagnosis not present

## 2016-03-17 DIAGNOSIS — I10 Essential (primary) hypertension: Secondary | ICD-10-CM | POA: Diagnosis not present

## 2016-03-17 DIAGNOSIS — I4891 Unspecified atrial fibrillation: Secondary | ICD-10-CM | POA: Diagnosis not present

## 2016-03-20 DIAGNOSIS — J111 Influenza due to unidentified influenza virus with other respiratory manifestations: Secondary | ICD-10-CM | POA: Diagnosis not present

## 2016-03-20 DIAGNOSIS — R1310 Dysphagia, unspecified: Secondary | ICD-10-CM | POA: Diagnosis not present

## 2016-03-20 DIAGNOSIS — E039 Hypothyroidism, unspecified: Secondary | ICD-10-CM | POA: Diagnosis not present

## 2016-03-20 DIAGNOSIS — G309 Alzheimer's disease, unspecified: Secondary | ICD-10-CM | POA: Diagnosis not present

## 2016-03-20 DIAGNOSIS — R630 Anorexia: Secondary | ICD-10-CM | POA: Diagnosis not present

## 2016-03-20 DIAGNOSIS — F0281 Dementia in other diseases classified elsewhere with behavioral disturbance: Secondary | ICD-10-CM | POA: Diagnosis not present

## 2016-03-20 DIAGNOSIS — I10 Essential (primary) hypertension: Secondary | ICD-10-CM | POA: Diagnosis not present

## 2016-03-20 DIAGNOSIS — H409 Unspecified glaucoma: Secondary | ICD-10-CM | POA: Diagnosis not present

## 2016-03-20 DIAGNOSIS — R509 Fever, unspecified: Secondary | ICD-10-CM | POA: Diagnosis not present

## 2016-03-20 DIAGNOSIS — I4891 Unspecified atrial fibrillation: Secondary | ICD-10-CM | POA: Diagnosis not present

## 2016-03-20 DIAGNOSIS — R634 Abnormal weight loss: Secondary | ICD-10-CM | POA: Diagnosis not present

## 2016-03-20 DIAGNOSIS — K219 Gastro-esophageal reflux disease without esophagitis: Secondary | ICD-10-CM | POA: Diagnosis not present

## 2016-04-07 ENCOUNTER — Emergency Department (HOSPITAL_COMMUNITY)
Admission: EM | Admit: 2016-04-07 | Discharge: 2016-04-08 | Disposition: A | Discharge status: Home / Self Care | Attending: Emergency Medicine | Admitting: Emergency Medicine

## 2016-04-07 ENCOUNTER — Emergency Department (HOSPITAL_COMMUNITY)

## 2016-04-07 ENCOUNTER — Encounter (HOSPITAL_COMMUNITY): Payer: Self-pay | Admitting: Emergency Medicine

## 2016-04-07 DIAGNOSIS — Z7902 Long term (current) use of antithrombotics/antiplatelets: Secondary | ICD-10-CM | POA: Insufficient documentation

## 2016-04-07 DIAGNOSIS — R531 Weakness: Secondary | ICD-10-CM | POA: Diagnosis not present

## 2016-04-07 DIAGNOSIS — J111 Influenza due to unidentified influenza virus with other respiratory manifestations: Secondary | ICD-10-CM | POA: Diagnosis not present

## 2016-04-07 DIAGNOSIS — Z79899 Other long term (current) drug therapy: Secondary | ICD-10-CM | POA: Diagnosis not present

## 2016-04-07 DIAGNOSIS — R69 Illness, unspecified: Secondary | ICD-10-CM

## 2016-04-07 DIAGNOSIS — R509 Fever, unspecified: Secondary | ICD-10-CM | POA: Diagnosis not present

## 2016-04-07 LAB — CBC WITH DIFFERENTIAL/PLATELET
Basophils Absolute: 0 10*3/uL (ref 0.0–0.1)
Basophils Relative: 0 %
Eosinophils Absolute: 0 10*3/uL (ref 0.0–0.7)
Eosinophils Relative: 0 %
HCT: 34.9 % — ABNORMAL LOW (ref 36.0–46.0)
Hemoglobin: 11.7 g/dL — ABNORMAL LOW (ref 12.0–15.0)
LYMPHS PCT: 15 %
Lymphs Abs: 2.1 10*3/uL (ref 0.7–4.0)
MCH: 31 pg (ref 26.0–34.0)
MCHC: 33.5 g/dL (ref 30.0–36.0)
MCV: 92.3 fL (ref 78.0–100.0)
MONO ABS: 0.9 10*3/uL (ref 0.1–1.0)
Monocytes Relative: 6 %
Neutro Abs: 10.8 10*3/uL — ABNORMAL HIGH (ref 1.7–7.7)
Neutrophils Relative %: 79 %
Platelets: 209 10*3/uL (ref 150–400)
RBC: 3.78 MIL/uL — AB (ref 3.87–5.11)
RDW: 14.1 % (ref 11.5–15.5)
WBC: 13.8 10*3/uL — AB (ref 4.0–10.5)

## 2016-04-07 LAB — COMPREHENSIVE METABOLIC PANEL
ALT: 13 U/L — ABNORMAL LOW (ref 14–54)
AST: 21 U/L (ref 15–41)
Albumin: 3.3 g/dL — ABNORMAL LOW (ref 3.5–5.0)
Alkaline Phosphatase: 56 U/L (ref 38–126)
Anion gap: 10 (ref 5–15)
BILIRUBIN TOTAL: 0.6 mg/dL (ref 0.3–1.2)
BUN: 17 mg/dL (ref 6–20)
CALCIUM: 8.4 mg/dL — AB (ref 8.9–10.3)
CO2: 22 mmol/L (ref 22–32)
Chloride: 103 mmol/L (ref 101–111)
Creatinine, Ser: 0.71 mg/dL (ref 0.44–1.00)
GFR calc Af Amer: >60 mL/min (ref >60)
Glucose, Bld: 173 mg/dL — ABNORMAL HIGH (ref 65–99)
Potassium: 3.5 mmol/L (ref 3.5–5.1)
Sodium: 135 mmol/L (ref 135–145)
TOTAL PROTEIN: 6.2 g/dL — AB (ref 6.5–8.1)

## 2016-04-07 LAB — URINALYSIS, ROUTINE W REFLEX MICROSCOPIC
BILIRUBIN URINE: NEGATIVE
Glucose, UA: NEGATIVE mg/dL
HGB URINE DIPSTICK: NEGATIVE
KETONES UR: 5 mg/dL — AB
Leukocytes, UA: NEGATIVE
NITRITE: NEGATIVE
PROTEIN: NEGATIVE mg/dL
Specific Gravity, Urine: 1.019 (ref 1.005–1.030)
pH: 5 (ref 5.0–8.0)

## 2016-04-07 MED ORDER — ACETAMINOPHEN 500 MG PO TABS: 1000.0000 mg | ORAL_TABLET | Freq: Once | ORAL | Status: DC

## 2016-04-07 MED ORDER — ACETAMINOPHEN 325 MG PO TABS: 650.0000 mg | ORAL_TABLET | Freq: Once | ORAL | Status: DC

## 2016-04-07 MED ORDER — ACETAMINOPHEN 650 MG RE SUPP
650.0000 mg | Freq: Once | RECTAL | Status: AC
Start: 2016-04-07 — End: 2016-04-07
  Administered 2016-04-07: 650 mg via RECTAL
  Filled 2016-04-07: qty 1

## 2016-04-07 NOTE — ED Triage Notes (Signed)
Pt from local nursing facility sent for fever and decreased activity and decreased po intake. EMS states pt roommate at facility [facility]

## 2016-04-07 NOTE — ED Notes (Signed)
ED Provider at bedside. 

## 2016-04-08 DIAGNOSIS — R509 Fever, unspecified: Secondary | ICD-10-CM | POA: Diagnosis not present

## 2016-04-08 DIAGNOSIS — R066 Hiccough: Secondary | ICD-10-CM | POA: Diagnosis not present

## 2016-04-08 DIAGNOSIS — R531 Weakness: Secondary | ICD-10-CM | POA: Diagnosis not present

## 2016-04-08 MED ORDER — IBUPROFEN 200 MG PO TABS
600.0000 mg | ORAL_TABLET | Freq: Once | ORAL | Status: AC
  Administered 2016-04-08: 600 mg via ORAL
  Filled 2016-04-08: qty 3

## 2016-04-08 MED ORDER — OSELTAMIVIR PHOSPHATE 75 MG PO CAPS: 75.0000 mg | ORAL_CAPSULE | Freq: Two times a day (BID) | ORAL | 0 refills | Status: AC

## 2016-04-08 NOTE — ED Provider Notes (Signed)
[facility]-EMERGENCY DEPT Provider Note   CSN: [id number] Arrival date & time: 04/07/16  2037     History   Chief Complaint Chief Complaint  Patient presents with  . Fever    HPI Noami Bartlett is a 83 y.o. female.  HPI 83 year old female with a history of dementia presents to the ED with 1 day of fever. History was provided by family report that the patient's roommate was diagnosed with influenza and the patient was placed on empiric Tamiflu. They report that the patient has been coughing for the past couple of days. Denies any vomiting or emesis. Patient does have a history of prior urinary tract infections. Patient is able to tolerate by mouth intake.  Remainder of history, ROS, and physical exam limited due to patient's condition (dementia). Additional information was obtained from family.   Level V Caveat.   Past Medical History:  Diagnosis Date  . Dementia   . Thyroid disease     There are no active problems to display for this patient.   Past Surgical History:  Procedure Laterality Date  . CHOLECYSTECTOMY      OB History    No data available       Home Medications    Prior to Admission medications   Medication Sig Start Date End Date Taking? Authorizing Provider  apixaban (ELIQUIS) 2.5 MG TABS tablet Take 2.5 mg by mouth 2 (two) times daily.    Yes Historical Provider, MD  divalproex (DEPAKOTE SPRINKLE) 125 MG capsule Take 250 mg by mouth 2 (two) times daily.   Yes Historical Provider, MD  latanoprost (XALATAN) 0.005 % ophthalmic solution Place 1 drop into both eyes at bedtime.   Yes Historical Provider, MD  levothyroxine (SYNTHROID, LEVOTHROID) 50 MCG tablet Take 50 mcg by mouth daily before breakfast.   Yes Historical Provider, MD  LORazepam (ATIVAN) 0.5 MG tablet Take 0.5 mg by mouth 2 (two) times daily. Give .5mg  by mouth Once daily as needed and .25mg  by mouth at 8pm every night.   Yes Historical Provider, MD  Melatonin 3 MG TABS Take 3 mg by mouth at  bedtime.   Yes Historical Provider, MD  metoprolol succinate (TOPROL-XL) 25 MG 24 hr tablet Take 25 mg by mouth daily.   Yes Historical Provider, MD  mirtazapine (REMERON) 15 MG tablet Take 7.5 mg by mouth at bedtime.   Yes Historical Provider, MD  polyethylene glycol (MIRALAX / GLYCOLAX) packet Take 17 g by mouth daily.   Yes Historical Provider, MD  ranitidine (ZANTAC) 150 MG tablet Take 150 mg by mouth 2 (two) times daily.   Yes Historical Provider, MD  sertraline (ZOLOFT) 100 MG tablet Take 100 mg by mouth daily.   Yes Historical Provider, MD  oseltamivir (TAMIFLU) 75 MG capsule Take 1 capsule (75 mg total) by mouth 2 (two) times daily. 04/08/16 04/13/16  Nira Conn, MD    Family History History reviewed. No pertinent family history.  Social History Social History  Substance Use Topics  . Smoking status: Never Smoker  . Smokeless tobacco: Never Used  . Alcohol use No     Allergies   Ace inhibitors and Depakote [divalproex sodium]   Review of Systems Review of Systems  Unable to perform ROS: Dementia     Physical Exam Updated Vital Signs BP 114/67 (BP Location: Left Arm)   Pulse 78   Temp 101.5 F (38.6 C) (Rectal)   Resp 18   SpO2 95%   Physical Exam  Constitutional: She appears  well-developed and well-nourished. No distress.  HENT:  Head: Normocephalic and atraumatic.  Nose: Nose normal.  Eyes: Conjunctivae and EOM are normal. Pupils are equal, round, and reactive to light. Right eye exhibits no discharge. Left eye exhibits no discharge. No scleral icterus.  Neck: Normal range of motion. Neck supple.  Cardiovascular: Normal rate and regular rhythm.  Exam reveals no gallop and no friction rub.   No murmur heard. Pulmonary/Chest: Effort normal and breath sounds normal. No stridor. No respiratory distress. She has no rales.  Abdominal: Soft. She exhibits no distension. There is no tenderness.  Musculoskeletal: She exhibits no edema or tenderness.    Neurological: She is alert. She is disoriented.  Skin: Skin is warm and dry. No rash noted. She is not diaphoretic. No erythema.  Psychiatric: She has a normal mood and affect.  Vitals reviewed.    ED Treatments / Results  Labs (all labs ordered are listed, but only abnormal results are displayed) Labs Reviewed  CBC WITH DIFFERENTIAL/PLATELET - Abnormal; Notable for the following:       Result Value   WBC 13.8 (*)    RBC 3.78 (*)    Hemoglobin 11.7 (*)    HCT 34.9 (*)    Neutro Abs 10.8 (*)    All other components within normal limits  COMPREHENSIVE METABOLIC PANEL - Abnormal; Notable for the following:    Glucose, Bld 173 (*)    Calcium 8.4 (*)    Total Protein 6.2 (*)    Albumin 3.3 (*)    ALT 13 (*)    All other components within normal limits  URINALYSIS, ROUTINE W REFLEX MICROSCOPIC - Abnormal; Notable for the following:    Ketones, ur 5 (*)    All other components within normal limits    EKG  EKG Interpretation None       Radiology Dg Chest 2 View  Result Date: 04/07/2016 CLINICAL DATA:  Fever, decreased activity. EXAM: CHEST  2 VIEW COMPARISON:  Chest radiograph June 17, 2014 FINDINGS: Cardiac silhouette is normal. Tortuous, ectatic aorta is unchanged. No pleural effusion or focal consolidation. Similar coarsened pulmonary interstitium. No pneumothorax. High-riding humeral head compatible with old rotator cuff injuries. Mild degenerative change of thoracic spine. IMPRESSION: Chronic interstitial changes. Electronically Signed   By: Awilda Metro M.D.   On: 04/07/2016 22:40    Procedures Procedures (including critical care time)  Medications Ordered in ED Medications  acetaminophen (TYLENOL) suppository 650 mg (not administered)  ibuprofen (ADVIL,MOTRIN) tablet 600 mg (not administered)     Initial Impression / Assessment and Plan / ED Course  I have reviewed the triage vital signs and the nursing notes.  Pertinent labs & imaging results that were  available during my care of the patient were reviewed by me and considered in my medical decision making (see chart for details).     Workup revealed leukocytosis. Chest x-ray without evidence of pneumonia. Urine without evidence of infection. Given the history is most likely secondary to influenza. Patient is already on daily Tamiflu and will have them increase to twice a day for treatment dose.   She is safe for discharge back to her facility with strict return precautions.  Final Clinical Impressions(s) / ED Diagnoses   Final diagnoses:  Influenza-like illness   Disposition: Discharge  Condition: Good  I have discussed the results, Dx and Tx plan with the patient's family who expressed understanding and agree(s) with the plan. Discharge instructions discussed at great length. The patient's family was  given strict return precautions who verbalized understanding of the instructions. No further questions at time of discharge.    Current Discharge Medication List      Follow Up: Merlene LaughterHal Stoneking, MD [address] [place] [id number] [phone]  Schedule an appointment as soon as possible for a visit  in 5-7 days, If symptoms do not improve or  worsen      Nira ConnPedro Eduardo Abrahan Fulmore, MD 04/08/16 [phone]

## 2016-04-08 NOTE — ED Notes (Addendum)
[facility] called for transport pt to [facility] discharge instructions reviewed with family members

## 2016-04-20 DIAGNOSIS — G309 Alzheimer's disease, unspecified: Secondary | ICD-10-CM | POA: Diagnosis not present

## 2016-04-20 DIAGNOSIS — I10 Essential (primary) hypertension: Secondary | ICD-10-CM | POA: Diagnosis not present

## 2016-04-20 DIAGNOSIS — H409 Unspecified glaucoma: Secondary | ICD-10-CM | POA: Diagnosis not present

## 2016-04-20 DIAGNOSIS — R1310 Dysphagia, unspecified: Secondary | ICD-10-CM | POA: Diagnosis not present

## 2016-04-20 DIAGNOSIS — R630 Anorexia: Secondary | ICD-10-CM | POA: Diagnosis not present

## 2016-04-20 DIAGNOSIS — R634 Abnormal weight loss: Secondary | ICD-10-CM | POA: Diagnosis not present

## 2016-04-20 DIAGNOSIS — K219 Gastro-esophageal reflux disease without esophagitis: Secondary | ICD-10-CM | POA: Diagnosis not present

## 2016-04-20 DIAGNOSIS — E039 Hypothyroidism, unspecified: Secondary | ICD-10-CM | POA: Diagnosis not present

## 2016-04-20 DIAGNOSIS — F0281 Dementia in other diseases classified elsewhere with behavioral disturbance: Secondary | ICD-10-CM | POA: Diagnosis not present

## 2016-04-20 DIAGNOSIS — I4891 Unspecified atrial fibrillation: Secondary | ICD-10-CM | POA: Diagnosis not present

## 2016-04-21 DIAGNOSIS — I10 Essential (primary) hypertension: Secondary | ICD-10-CM | POA: Diagnosis not present

## 2016-04-21 DIAGNOSIS — K219 Gastro-esophageal reflux disease without esophagitis: Secondary | ICD-10-CM | POA: Diagnosis not present

## 2016-04-21 DIAGNOSIS — I4891 Unspecified atrial fibrillation: Secondary | ICD-10-CM | POA: Diagnosis not present

## 2016-04-21 DIAGNOSIS — R1310 Dysphagia, unspecified: Secondary | ICD-10-CM | POA: Diagnosis not present

## 2016-04-21 DIAGNOSIS — F0281 Dementia in other diseases classified elsewhere with behavioral disturbance: Secondary | ICD-10-CM | POA: Diagnosis not present

## 2016-04-21 DIAGNOSIS — G309 Alzheimer's disease, unspecified: Secondary | ICD-10-CM | POA: Diagnosis not present

## 2016-04-24 DIAGNOSIS — R1310 Dysphagia, unspecified: Secondary | ICD-10-CM | POA: Diagnosis not present

## 2016-04-24 DIAGNOSIS — K219 Gastro-esophageal reflux disease without esophagitis: Secondary | ICD-10-CM | POA: Diagnosis not present

## 2016-04-24 DIAGNOSIS — I4891 Unspecified atrial fibrillation: Secondary | ICD-10-CM | POA: Diagnosis not present

## 2016-04-24 DIAGNOSIS — F0281 Dementia in other diseases classified elsewhere with behavioral disturbance: Secondary | ICD-10-CM | POA: Diagnosis not present

## 2016-04-24 DIAGNOSIS — I10 Essential (primary) hypertension: Secondary | ICD-10-CM | POA: Diagnosis not present

## 2016-04-24 DIAGNOSIS — G309 Alzheimer's disease, unspecified: Secondary | ICD-10-CM | POA: Diagnosis not present

## 2016-04-25 DIAGNOSIS — G309 Alzheimer's disease, unspecified: Secondary | ICD-10-CM | POA: Diagnosis not present

## 2016-04-25 DIAGNOSIS — R1310 Dysphagia, unspecified: Secondary | ICD-10-CM | POA: Diagnosis not present

## 2016-04-25 DIAGNOSIS — F0281 Dementia in other diseases classified elsewhere with behavioral disturbance: Secondary | ICD-10-CM | POA: Diagnosis not present

## 2016-04-25 DIAGNOSIS — I10 Essential (primary) hypertension: Secondary | ICD-10-CM | POA: Diagnosis not present

## 2016-04-25 DIAGNOSIS — K219 Gastro-esophageal reflux disease without esophagitis: Secondary | ICD-10-CM | POA: Diagnosis not present

## 2016-04-25 DIAGNOSIS — I4891 Unspecified atrial fibrillation: Secondary | ICD-10-CM | POA: Diagnosis not present

## 2016-04-26 DIAGNOSIS — G309 Alzheimer's disease, unspecified: Secondary | ICD-10-CM | POA: Diagnosis not present

## 2016-04-26 DIAGNOSIS — I4891 Unspecified atrial fibrillation: Secondary | ICD-10-CM | POA: Diagnosis not present

## 2016-04-26 DIAGNOSIS — I10 Essential (primary) hypertension: Secondary | ICD-10-CM | POA: Diagnosis not present

## 2016-04-26 DIAGNOSIS — R1310 Dysphagia, unspecified: Secondary | ICD-10-CM | POA: Diagnosis not present

## 2016-04-26 DIAGNOSIS — K219 Gastro-esophageal reflux disease without esophagitis: Secondary | ICD-10-CM | POA: Diagnosis not present

## 2016-04-26 DIAGNOSIS — F0281 Dementia in other diseases classified elsewhere with behavioral disturbance: Secondary | ICD-10-CM | POA: Diagnosis not present

## 2016-04-27 DIAGNOSIS — R1310 Dysphagia, unspecified: Secondary | ICD-10-CM | POA: Diagnosis not present

## 2016-04-27 DIAGNOSIS — I4891 Unspecified atrial fibrillation: Secondary | ICD-10-CM | POA: Diagnosis not present

## 2016-04-27 DIAGNOSIS — G309 Alzheimer's disease, unspecified: Secondary | ICD-10-CM | POA: Diagnosis not present

## 2016-04-27 DIAGNOSIS — F0281 Dementia in other diseases classified elsewhere with behavioral disturbance: Secondary | ICD-10-CM | POA: Diagnosis not present

## 2016-04-27 DIAGNOSIS — I10 Essential (primary) hypertension: Secondary | ICD-10-CM | POA: Diagnosis not present

## 2016-04-27 DIAGNOSIS — K219 Gastro-esophageal reflux disease without esophagitis: Secondary | ICD-10-CM | POA: Diagnosis not present

## 2016-05-02 DIAGNOSIS — F0281 Dementia in other diseases classified elsewhere with behavioral disturbance: Secondary | ICD-10-CM | POA: Diagnosis not present

## 2016-05-02 DIAGNOSIS — I10 Essential (primary) hypertension: Secondary | ICD-10-CM | POA: Diagnosis not present

## 2016-05-02 DIAGNOSIS — R1310 Dysphagia, unspecified: Secondary | ICD-10-CM | POA: Diagnosis not present

## 2016-05-02 DIAGNOSIS — I4891 Unspecified atrial fibrillation: Secondary | ICD-10-CM | POA: Diagnosis not present

## 2016-05-02 DIAGNOSIS — G309 Alzheimer's disease, unspecified: Secondary | ICD-10-CM | POA: Diagnosis not present

## 2016-05-02 DIAGNOSIS — K219 Gastro-esophageal reflux disease without esophagitis: Secondary | ICD-10-CM | POA: Diagnosis not present

## 2016-05-03 DIAGNOSIS — K219 Gastro-esophageal reflux disease without esophagitis: Secondary | ICD-10-CM | POA: Diagnosis not present

## 2016-05-03 DIAGNOSIS — I10 Essential (primary) hypertension: Secondary | ICD-10-CM | POA: Diagnosis not present

## 2016-05-03 DIAGNOSIS — I4891 Unspecified atrial fibrillation: Secondary | ICD-10-CM | POA: Diagnosis not present

## 2016-05-03 DIAGNOSIS — G309 Alzheimer's disease, unspecified: Secondary | ICD-10-CM | POA: Diagnosis not present

## 2016-05-03 DIAGNOSIS — F0281 Dementia in other diseases classified elsewhere with behavioral disturbance: Secondary | ICD-10-CM | POA: Diagnosis not present

## 2016-05-03 DIAGNOSIS — R1310 Dysphagia, unspecified: Secondary | ICD-10-CM | POA: Diagnosis not present

## 2016-05-04 DIAGNOSIS — I10 Essential (primary) hypertension: Secondary | ICD-10-CM | POA: Diagnosis not present

## 2016-05-04 DIAGNOSIS — K219 Gastro-esophageal reflux disease without esophagitis: Secondary | ICD-10-CM | POA: Diagnosis not present

## 2016-05-04 DIAGNOSIS — G309 Alzheimer's disease, unspecified: Secondary | ICD-10-CM | POA: Diagnosis not present

## 2016-05-04 DIAGNOSIS — F0281 Dementia in other diseases classified elsewhere with behavioral disturbance: Secondary | ICD-10-CM | POA: Diagnosis not present

## 2016-05-04 DIAGNOSIS — R1310 Dysphagia, unspecified: Secondary | ICD-10-CM | POA: Diagnosis not present

## 2016-05-04 DIAGNOSIS — I4891 Unspecified atrial fibrillation: Secondary | ICD-10-CM | POA: Diagnosis not present

## 2016-05-05 DIAGNOSIS — R1310 Dysphagia, unspecified: Secondary | ICD-10-CM | POA: Diagnosis not present

## 2016-05-05 DIAGNOSIS — I4891 Unspecified atrial fibrillation: Secondary | ICD-10-CM | POA: Diagnosis not present

## 2016-05-05 DIAGNOSIS — G309 Alzheimer's disease, unspecified: Secondary | ICD-10-CM | POA: Diagnosis not present

## 2016-05-05 DIAGNOSIS — I10 Essential (primary) hypertension: Secondary | ICD-10-CM | POA: Diagnosis not present

## 2016-05-05 DIAGNOSIS — F0281 Dementia in other diseases classified elsewhere with behavioral disturbance: Secondary | ICD-10-CM | POA: Diagnosis not present

## 2016-05-05 DIAGNOSIS — K219 Gastro-esophageal reflux disease without esophagitis: Secondary | ICD-10-CM | POA: Diagnosis not present

## 2016-05-09 DIAGNOSIS — F0281 Dementia in other diseases classified elsewhere with behavioral disturbance: Secondary | ICD-10-CM | POA: Diagnosis not present

## 2016-05-09 DIAGNOSIS — I10 Essential (primary) hypertension: Secondary | ICD-10-CM | POA: Diagnosis not present

## 2016-05-09 DIAGNOSIS — R1310 Dysphagia, unspecified: Secondary | ICD-10-CM | POA: Diagnosis not present

## 2016-05-09 DIAGNOSIS — I4891 Unspecified atrial fibrillation: Secondary | ICD-10-CM | POA: Diagnosis not present

## 2016-05-09 DIAGNOSIS — G309 Alzheimer's disease, unspecified: Secondary | ICD-10-CM | POA: Diagnosis not present

## 2016-05-09 DIAGNOSIS — K219 Gastro-esophageal reflux disease without esophagitis: Secondary | ICD-10-CM | POA: Diagnosis not present

## 2016-05-10 DIAGNOSIS — K219 Gastro-esophageal reflux disease without esophagitis: Secondary | ICD-10-CM | POA: Diagnosis not present

## 2016-05-10 DIAGNOSIS — G309 Alzheimer's disease, unspecified: Secondary | ICD-10-CM | POA: Diagnosis not present

## 2016-05-10 DIAGNOSIS — R1310 Dysphagia, unspecified: Secondary | ICD-10-CM | POA: Diagnosis not present

## 2016-05-10 DIAGNOSIS — I4891 Unspecified atrial fibrillation: Secondary | ICD-10-CM | POA: Diagnosis not present

## 2016-05-10 DIAGNOSIS — F0281 Dementia in other diseases classified elsewhere with behavioral disturbance: Secondary | ICD-10-CM | POA: Diagnosis not present

## 2016-05-10 DIAGNOSIS — I10 Essential (primary) hypertension: Secondary | ICD-10-CM | POA: Diagnosis not present

## 2016-05-11 DIAGNOSIS — R1310 Dysphagia, unspecified: Secondary | ICD-10-CM | POA: Diagnosis not present

## 2016-05-11 DIAGNOSIS — K219 Gastro-esophageal reflux disease without esophagitis: Secondary | ICD-10-CM | POA: Diagnosis not present

## 2016-05-11 DIAGNOSIS — I10 Essential (primary) hypertension: Secondary | ICD-10-CM | POA: Diagnosis not present

## 2016-05-11 DIAGNOSIS — G309 Alzheimer's disease, unspecified: Secondary | ICD-10-CM | POA: Diagnosis not present

## 2016-05-11 DIAGNOSIS — I4891 Unspecified atrial fibrillation: Secondary | ICD-10-CM | POA: Diagnosis not present

## 2016-05-11 DIAGNOSIS — F0281 Dementia in other diseases classified elsewhere with behavioral disturbance: Secondary | ICD-10-CM | POA: Diagnosis not present

## 2016-05-15 DIAGNOSIS — I4891 Unspecified atrial fibrillation: Secondary | ICD-10-CM | POA: Diagnosis not present

## 2016-05-15 DIAGNOSIS — K219 Gastro-esophageal reflux disease without esophagitis: Secondary | ICD-10-CM | POA: Diagnosis not present

## 2016-05-15 DIAGNOSIS — R1310 Dysphagia, unspecified: Secondary | ICD-10-CM | POA: Diagnosis not present

## 2016-05-15 DIAGNOSIS — G309 Alzheimer's disease, unspecified: Secondary | ICD-10-CM | POA: Diagnosis not present

## 2016-05-15 DIAGNOSIS — I10 Essential (primary) hypertension: Secondary | ICD-10-CM | POA: Diagnosis not present

## 2016-05-15 DIAGNOSIS — F0281 Dementia in other diseases classified elsewhere with behavioral disturbance: Secondary | ICD-10-CM | POA: Diagnosis not present

## 2016-05-16 DIAGNOSIS — I10 Essential (primary) hypertension: Secondary | ICD-10-CM | POA: Diagnosis not present

## 2016-05-16 DIAGNOSIS — I4891 Unspecified atrial fibrillation: Secondary | ICD-10-CM | POA: Diagnosis not present

## 2016-05-16 DIAGNOSIS — K219 Gastro-esophageal reflux disease without esophagitis: Secondary | ICD-10-CM | POA: Diagnosis not present

## 2016-05-16 DIAGNOSIS — R1310 Dysphagia, unspecified: Secondary | ICD-10-CM | POA: Diagnosis not present

## 2016-05-16 DIAGNOSIS — F0281 Dementia in other diseases classified elsewhere with behavioral disturbance: Secondary | ICD-10-CM | POA: Diagnosis not present

## 2016-05-16 DIAGNOSIS — G309 Alzheimer's disease, unspecified: Secondary | ICD-10-CM | POA: Diagnosis not present

## 2016-05-18 DIAGNOSIS — M2010 Hallux valgus (acquired), unspecified foot: Secondary | ICD-10-CM | POA: Diagnosis not present

## 2016-05-18 DIAGNOSIS — K219 Gastro-esophageal reflux disease without esophagitis: Secondary | ICD-10-CM | POA: Diagnosis not present

## 2016-05-18 DIAGNOSIS — L603 Nail dystrophy: Secondary | ICD-10-CM | POA: Diagnosis not present

## 2016-05-18 DIAGNOSIS — L853 Xerosis cutis: Secondary | ICD-10-CM | POA: Diagnosis not present

## 2016-05-18 DIAGNOSIS — E039 Hypothyroidism, unspecified: Secondary | ICD-10-CM | POA: Diagnosis not present

## 2016-05-18 DIAGNOSIS — I10 Essential (primary) hypertension: Secondary | ICD-10-CM | POA: Diagnosis not present

## 2016-05-18 DIAGNOSIS — I4891 Unspecified atrial fibrillation: Secondary | ICD-10-CM | POA: Diagnosis not present

## 2016-05-18 DIAGNOSIS — R634 Abnormal weight loss: Secondary | ICD-10-CM | POA: Diagnosis not present

## 2016-05-18 DIAGNOSIS — H409 Unspecified glaucoma: Secondary | ICD-10-CM | POA: Diagnosis not present

## 2016-05-18 DIAGNOSIS — G309 Alzheimer's disease, unspecified: Secondary | ICD-10-CM | POA: Diagnosis not present

## 2016-05-18 DIAGNOSIS — R1310 Dysphagia, unspecified: Secondary | ICD-10-CM | POA: Diagnosis not present

## 2016-05-18 DIAGNOSIS — B351 Tinea unguium: Secondary | ICD-10-CM | POA: Diagnosis not present

## 2016-05-18 DIAGNOSIS — Q845 Enlarged and hypertrophic nails: Secondary | ICD-10-CM | POA: Diagnosis not present

## 2016-05-18 DIAGNOSIS — R630 Anorexia: Secondary | ICD-10-CM | POA: Diagnosis not present

## 2016-05-18 DIAGNOSIS — F0281 Dementia in other diseases classified elsewhere with behavioral disturbance: Secondary | ICD-10-CM | POA: Diagnosis not present

## 2016-05-22 DIAGNOSIS — R1310 Dysphagia, unspecified: Secondary | ICD-10-CM | POA: Diagnosis not present

## 2016-05-22 DIAGNOSIS — I4891 Unspecified atrial fibrillation: Secondary | ICD-10-CM | POA: Diagnosis not present

## 2016-05-22 DIAGNOSIS — G309 Alzheimer's disease, unspecified: Secondary | ICD-10-CM | POA: Diagnosis not present

## 2016-05-22 DIAGNOSIS — K219 Gastro-esophageal reflux disease without esophagitis: Secondary | ICD-10-CM | POA: Diagnosis not present

## 2016-05-22 DIAGNOSIS — F0281 Dementia in other diseases classified elsewhere with behavioral disturbance: Secondary | ICD-10-CM | POA: Diagnosis not present

## 2016-05-22 DIAGNOSIS — I10 Essential (primary) hypertension: Secondary | ICD-10-CM | POA: Diagnosis not present

## 2016-05-23 DIAGNOSIS — I10 Essential (primary) hypertension: Secondary | ICD-10-CM | POA: Diagnosis not present

## 2016-05-23 DIAGNOSIS — I4891 Unspecified atrial fibrillation: Secondary | ICD-10-CM | POA: Diagnosis not present

## 2016-05-23 DIAGNOSIS — R1310 Dysphagia, unspecified: Secondary | ICD-10-CM | POA: Diagnosis not present

## 2016-05-23 DIAGNOSIS — F0281 Dementia in other diseases classified elsewhere with behavioral disturbance: Secondary | ICD-10-CM | POA: Diagnosis not present

## 2016-05-23 DIAGNOSIS — G309 Alzheimer's disease, unspecified: Secondary | ICD-10-CM | POA: Diagnosis not present

## 2016-05-23 DIAGNOSIS — K219 Gastro-esophageal reflux disease without esophagitis: Secondary | ICD-10-CM | POA: Diagnosis not present

## 2016-05-24 DIAGNOSIS — G309 Alzheimer's disease, unspecified: Secondary | ICD-10-CM | POA: Diagnosis not present

## 2016-05-24 DIAGNOSIS — R1310 Dysphagia, unspecified: Secondary | ICD-10-CM | POA: Diagnosis not present

## 2016-05-24 DIAGNOSIS — K219 Gastro-esophageal reflux disease without esophagitis: Secondary | ICD-10-CM | POA: Diagnosis not present

## 2016-05-24 DIAGNOSIS — I10 Essential (primary) hypertension: Secondary | ICD-10-CM | POA: Diagnosis not present

## 2016-05-24 DIAGNOSIS — I4891 Unspecified atrial fibrillation: Secondary | ICD-10-CM | POA: Diagnosis not present

## 2016-05-24 DIAGNOSIS — F0281 Dementia in other diseases classified elsewhere with behavioral disturbance: Secondary | ICD-10-CM | POA: Diagnosis not present

## 2016-05-25 DIAGNOSIS — R1310 Dysphagia, unspecified: Secondary | ICD-10-CM | POA: Diagnosis not present

## 2016-05-25 DIAGNOSIS — I10 Essential (primary) hypertension: Secondary | ICD-10-CM | POA: Diagnosis not present

## 2016-05-25 DIAGNOSIS — I4891 Unspecified atrial fibrillation: Secondary | ICD-10-CM | POA: Diagnosis not present

## 2016-05-25 DIAGNOSIS — F0281 Dementia in other diseases classified elsewhere with behavioral disturbance: Secondary | ICD-10-CM | POA: Diagnosis not present

## 2016-05-25 DIAGNOSIS — K219 Gastro-esophageal reflux disease without esophagitis: Secondary | ICD-10-CM | POA: Diagnosis not present

## 2016-05-25 DIAGNOSIS — G309 Alzheimer's disease, unspecified: Secondary | ICD-10-CM | POA: Diagnosis not present

## 2016-05-26 DIAGNOSIS — K219 Gastro-esophageal reflux disease without esophagitis: Secondary | ICD-10-CM | POA: Diagnosis not present

## 2016-05-26 DIAGNOSIS — N390 Urinary tract infection, site not specified: Secondary | ICD-10-CM | POA: Diagnosis not present

## 2016-05-26 DIAGNOSIS — F0281 Dementia in other diseases classified elsewhere with behavioral disturbance: Secondary | ICD-10-CM | POA: Diagnosis not present

## 2016-05-26 DIAGNOSIS — I4891 Unspecified atrial fibrillation: Secondary | ICD-10-CM | POA: Diagnosis not present

## 2016-05-26 DIAGNOSIS — G309 Alzheimer's disease, unspecified: Secondary | ICD-10-CM | POA: Diagnosis not present

## 2016-05-26 DIAGNOSIS — R1310 Dysphagia, unspecified: Secondary | ICD-10-CM | POA: Diagnosis not present

## 2016-05-26 DIAGNOSIS — I10 Essential (primary) hypertension: Secondary | ICD-10-CM | POA: Diagnosis not present

## 2016-05-29 DIAGNOSIS — I10 Essential (primary) hypertension: Secondary | ICD-10-CM | POA: Diagnosis not present

## 2016-05-29 DIAGNOSIS — F0281 Dementia in other diseases classified elsewhere with behavioral disturbance: Secondary | ICD-10-CM | POA: Diagnosis not present

## 2016-05-29 DIAGNOSIS — I4891 Unspecified atrial fibrillation: Secondary | ICD-10-CM | POA: Diagnosis not present

## 2016-05-29 DIAGNOSIS — G309 Alzheimer's disease, unspecified: Secondary | ICD-10-CM | POA: Diagnosis not present

## 2016-05-29 DIAGNOSIS — R1310 Dysphagia, unspecified: Secondary | ICD-10-CM | POA: Diagnosis not present

## 2016-05-29 DIAGNOSIS — K219 Gastro-esophageal reflux disease without esophagitis: Secondary | ICD-10-CM | POA: Diagnosis not present

## 2016-05-30 DIAGNOSIS — I4891 Unspecified atrial fibrillation: Secondary | ICD-10-CM | POA: Diagnosis not present

## 2016-05-30 DIAGNOSIS — F0281 Dementia in other diseases classified elsewhere with behavioral disturbance: Secondary | ICD-10-CM | POA: Diagnosis not present

## 2016-05-30 DIAGNOSIS — R1310 Dysphagia, unspecified: Secondary | ICD-10-CM | POA: Diagnosis not present

## 2016-05-30 DIAGNOSIS — G309 Alzheimer's disease, unspecified: Secondary | ICD-10-CM | POA: Diagnosis not present

## 2016-05-30 DIAGNOSIS — K219 Gastro-esophageal reflux disease without esophagitis: Secondary | ICD-10-CM | POA: Diagnosis not present

## 2016-05-30 DIAGNOSIS — I10 Essential (primary) hypertension: Secondary | ICD-10-CM | POA: Diagnosis not present

## 2016-06-01 DIAGNOSIS — I10 Essential (primary) hypertension: Secondary | ICD-10-CM | POA: Diagnosis not present

## 2016-06-01 DIAGNOSIS — F0281 Dementia in other diseases classified elsewhere with behavioral disturbance: Secondary | ICD-10-CM | POA: Diagnosis not present

## 2016-06-01 DIAGNOSIS — G309 Alzheimer's disease, unspecified: Secondary | ICD-10-CM | POA: Diagnosis not present

## 2016-06-01 DIAGNOSIS — R1310 Dysphagia, unspecified: Secondary | ICD-10-CM | POA: Diagnosis not present

## 2016-06-01 DIAGNOSIS — I4891 Unspecified atrial fibrillation: Secondary | ICD-10-CM | POA: Diagnosis not present

## 2016-06-01 DIAGNOSIS — K219 Gastro-esophageal reflux disease without esophagitis: Secondary | ICD-10-CM | POA: Diagnosis not present

## 2016-06-02 DIAGNOSIS — K219 Gastro-esophageal reflux disease without esophagitis: Secondary | ICD-10-CM | POA: Diagnosis not present

## 2016-06-02 DIAGNOSIS — G309 Alzheimer's disease, unspecified: Secondary | ICD-10-CM | POA: Diagnosis not present

## 2016-06-02 DIAGNOSIS — I4891 Unspecified atrial fibrillation: Secondary | ICD-10-CM | POA: Diagnosis not present

## 2016-06-02 DIAGNOSIS — R1310 Dysphagia, unspecified: Secondary | ICD-10-CM | POA: Diagnosis not present

## 2016-06-02 DIAGNOSIS — I10 Essential (primary) hypertension: Secondary | ICD-10-CM | POA: Diagnosis not present

## 2016-06-02 DIAGNOSIS — F0281 Dementia in other diseases classified elsewhere with behavioral disturbance: Secondary | ICD-10-CM | POA: Diagnosis not present

## 2016-06-05 DIAGNOSIS — I4891 Unspecified atrial fibrillation: Secondary | ICD-10-CM | POA: Diagnosis not present

## 2016-06-05 DIAGNOSIS — R1310 Dysphagia, unspecified: Secondary | ICD-10-CM | POA: Diagnosis not present

## 2016-06-05 DIAGNOSIS — K219 Gastro-esophageal reflux disease without esophagitis: Secondary | ICD-10-CM | POA: Diagnosis not present

## 2016-06-05 DIAGNOSIS — I10 Essential (primary) hypertension: Secondary | ICD-10-CM | POA: Diagnosis not present

## 2016-06-05 DIAGNOSIS — N390 Urinary tract infection, site not specified: Secondary | ICD-10-CM | POA: Diagnosis not present

## 2016-06-05 DIAGNOSIS — G309 Alzheimer's disease, unspecified: Secondary | ICD-10-CM | POA: Diagnosis not present

## 2016-06-05 DIAGNOSIS — F0281 Dementia in other diseases classified elsewhere with behavioral disturbance: Secondary | ICD-10-CM | POA: Diagnosis not present

## 2016-06-06 DIAGNOSIS — F0281 Dementia in other diseases classified elsewhere with behavioral disturbance: Secondary | ICD-10-CM | POA: Diagnosis not present

## 2016-06-06 DIAGNOSIS — I4891 Unspecified atrial fibrillation: Secondary | ICD-10-CM | POA: Diagnosis not present

## 2016-06-06 DIAGNOSIS — K219 Gastro-esophageal reflux disease without esophagitis: Secondary | ICD-10-CM | POA: Diagnosis not present

## 2016-06-06 DIAGNOSIS — I10 Essential (primary) hypertension: Secondary | ICD-10-CM | POA: Diagnosis not present

## 2016-06-06 DIAGNOSIS — R1310 Dysphagia, unspecified: Secondary | ICD-10-CM | POA: Diagnosis not present

## 2016-06-06 DIAGNOSIS — G309 Alzheimer's disease, unspecified: Secondary | ICD-10-CM | POA: Diagnosis not present

## 2016-06-07 DIAGNOSIS — K219 Gastro-esophageal reflux disease without esophagitis: Secondary | ICD-10-CM | POA: Diagnosis not present

## 2016-06-07 DIAGNOSIS — F0281 Dementia in other diseases classified elsewhere with behavioral disturbance: Secondary | ICD-10-CM | POA: Diagnosis not present

## 2016-06-07 DIAGNOSIS — I4891 Unspecified atrial fibrillation: Secondary | ICD-10-CM | POA: Diagnosis not present

## 2016-06-07 DIAGNOSIS — I10 Essential (primary) hypertension: Secondary | ICD-10-CM | POA: Diagnosis not present

## 2016-06-07 DIAGNOSIS — G309 Alzheimer's disease, unspecified: Secondary | ICD-10-CM | POA: Diagnosis not present

## 2016-06-07 DIAGNOSIS — R1310 Dysphagia, unspecified: Secondary | ICD-10-CM | POA: Diagnosis not present

## 2016-06-08 DIAGNOSIS — G309 Alzheimer's disease, unspecified: Secondary | ICD-10-CM | POA: Diagnosis not present

## 2016-06-08 DIAGNOSIS — K219 Gastro-esophageal reflux disease without esophagitis: Secondary | ICD-10-CM | POA: Diagnosis not present

## 2016-06-08 DIAGNOSIS — I10 Essential (primary) hypertension: Secondary | ICD-10-CM | POA: Diagnosis not present

## 2016-06-08 DIAGNOSIS — R1310 Dysphagia, unspecified: Secondary | ICD-10-CM | POA: Diagnosis not present

## 2016-06-08 DIAGNOSIS — F0281 Dementia in other diseases classified elsewhere with behavioral disturbance: Secondary | ICD-10-CM | POA: Diagnosis not present

## 2016-06-08 DIAGNOSIS — I4891 Unspecified atrial fibrillation: Secondary | ICD-10-CM | POA: Diagnosis not present

## 2016-06-09 DIAGNOSIS — I10 Essential (primary) hypertension: Secondary | ICD-10-CM | POA: Diagnosis not present

## 2016-06-09 DIAGNOSIS — R1310 Dysphagia, unspecified: Secondary | ICD-10-CM | POA: Diagnosis not present

## 2016-06-09 DIAGNOSIS — I4891 Unspecified atrial fibrillation: Secondary | ICD-10-CM | POA: Diagnosis not present

## 2016-06-09 DIAGNOSIS — F0281 Dementia in other diseases classified elsewhere with behavioral disturbance: Secondary | ICD-10-CM | POA: Diagnosis not present

## 2016-06-09 DIAGNOSIS — G309 Alzheimer's disease, unspecified: Secondary | ICD-10-CM | POA: Diagnosis not present

## 2016-06-09 DIAGNOSIS — K219 Gastro-esophageal reflux disease without esophagitis: Secondary | ICD-10-CM | POA: Diagnosis not present

## 2016-06-12 DIAGNOSIS — I10 Essential (primary) hypertension: Secondary | ICD-10-CM | POA: Diagnosis not present

## 2016-06-12 DIAGNOSIS — F0281 Dementia in other diseases classified elsewhere with behavioral disturbance: Secondary | ICD-10-CM | POA: Diagnosis not present

## 2016-06-12 DIAGNOSIS — G309 Alzheimer's disease, unspecified: Secondary | ICD-10-CM | POA: Diagnosis not present

## 2016-06-12 DIAGNOSIS — R1310 Dysphagia, unspecified: Secondary | ICD-10-CM | POA: Diagnosis not present

## 2016-06-12 DIAGNOSIS — I4891 Unspecified atrial fibrillation: Secondary | ICD-10-CM | POA: Diagnosis not present

## 2016-06-12 DIAGNOSIS — K219 Gastro-esophageal reflux disease without esophagitis: Secondary | ICD-10-CM | POA: Diagnosis not present

## 2016-06-13 DIAGNOSIS — I10 Essential (primary) hypertension: Secondary | ICD-10-CM | POA: Diagnosis not present

## 2016-06-13 DIAGNOSIS — G309 Alzheimer's disease, unspecified: Secondary | ICD-10-CM | POA: Diagnosis not present

## 2016-06-13 DIAGNOSIS — I4891 Unspecified atrial fibrillation: Secondary | ICD-10-CM | POA: Diagnosis not present

## 2016-06-13 DIAGNOSIS — R1310 Dysphagia, unspecified: Secondary | ICD-10-CM | POA: Diagnosis not present

## 2016-06-13 DIAGNOSIS — F0281 Dementia in other diseases classified elsewhere with behavioral disturbance: Secondary | ICD-10-CM | POA: Diagnosis not present

## 2016-06-13 DIAGNOSIS — K219 Gastro-esophageal reflux disease without esophagitis: Secondary | ICD-10-CM | POA: Diagnosis not present

## 2016-06-15 DIAGNOSIS — G309 Alzheimer's disease, unspecified: Secondary | ICD-10-CM | POA: Diagnosis not present

## 2016-06-15 DIAGNOSIS — I4891 Unspecified atrial fibrillation: Secondary | ICD-10-CM | POA: Diagnosis not present

## 2016-06-15 DIAGNOSIS — I10 Essential (primary) hypertension: Secondary | ICD-10-CM | POA: Diagnosis not present

## 2016-06-15 DIAGNOSIS — K219 Gastro-esophageal reflux disease without esophagitis: Secondary | ICD-10-CM | POA: Diagnosis not present

## 2016-06-15 DIAGNOSIS — R1310 Dysphagia, unspecified: Secondary | ICD-10-CM | POA: Diagnosis not present

## 2016-06-15 DIAGNOSIS — F0281 Dementia in other diseases classified elsewhere with behavioral disturbance: Secondary | ICD-10-CM | POA: Diagnosis not present

## 2016-06-18 DIAGNOSIS — K219 Gastro-esophageal reflux disease without esophagitis: Secondary | ICD-10-CM | POA: Diagnosis not present

## 2016-06-18 DIAGNOSIS — R1310 Dysphagia, unspecified: Secondary | ICD-10-CM | POA: Diagnosis not present

## 2016-06-18 DIAGNOSIS — I4891 Unspecified atrial fibrillation: Secondary | ICD-10-CM | POA: Diagnosis not present

## 2016-06-18 DIAGNOSIS — G309 Alzheimer's disease, unspecified: Secondary | ICD-10-CM | POA: Diagnosis not present

## 2016-06-18 DIAGNOSIS — R630 Anorexia: Secondary | ICD-10-CM | POA: Diagnosis not present

## 2016-06-18 DIAGNOSIS — I10 Essential (primary) hypertension: Secondary | ICD-10-CM | POA: Diagnosis not present

## 2016-06-18 DIAGNOSIS — F0281 Dementia in other diseases classified elsewhere with behavioral disturbance: Secondary | ICD-10-CM | POA: Diagnosis not present

## 2016-06-18 DIAGNOSIS — E039 Hypothyroidism, unspecified: Secondary | ICD-10-CM | POA: Diagnosis not present

## 2016-06-18 DIAGNOSIS — H409 Unspecified glaucoma: Secondary | ICD-10-CM | POA: Diagnosis not present

## 2016-06-18 DIAGNOSIS — R634 Abnormal weight loss: Secondary | ICD-10-CM | POA: Diagnosis not present

## 2016-06-19 DIAGNOSIS — K219 Gastro-esophageal reflux disease without esophagitis: Secondary | ICD-10-CM | POA: Diagnosis not present

## 2016-06-19 DIAGNOSIS — G309 Alzheimer's disease, unspecified: Secondary | ICD-10-CM | POA: Diagnosis not present

## 2016-06-19 DIAGNOSIS — I10 Essential (primary) hypertension: Secondary | ICD-10-CM | POA: Diagnosis not present

## 2016-06-19 DIAGNOSIS — I4891 Unspecified atrial fibrillation: Secondary | ICD-10-CM | POA: Diagnosis not present

## 2016-06-19 DIAGNOSIS — F0281 Dementia in other diseases classified elsewhere with behavioral disturbance: Secondary | ICD-10-CM | POA: Diagnosis not present

## 2016-06-19 DIAGNOSIS — R1310 Dysphagia, unspecified: Secondary | ICD-10-CM | POA: Diagnosis not present

## 2016-06-20 DIAGNOSIS — R1310 Dysphagia, unspecified: Secondary | ICD-10-CM | POA: Diagnosis not present

## 2016-06-20 DIAGNOSIS — G309 Alzheimer's disease, unspecified: Secondary | ICD-10-CM | POA: Diagnosis not present

## 2016-06-20 DIAGNOSIS — I10 Essential (primary) hypertension: Secondary | ICD-10-CM | POA: Diagnosis not present

## 2016-06-20 DIAGNOSIS — I4891 Unspecified atrial fibrillation: Secondary | ICD-10-CM | POA: Diagnosis not present

## 2016-06-20 DIAGNOSIS — K219 Gastro-esophageal reflux disease without esophagitis: Secondary | ICD-10-CM | POA: Diagnosis not present

## 2016-06-20 DIAGNOSIS — F0281 Dementia in other diseases classified elsewhere with behavioral disturbance: Secondary | ICD-10-CM | POA: Diagnosis not present

## 2016-06-22 DIAGNOSIS — K219 Gastro-esophageal reflux disease without esophagitis: Secondary | ICD-10-CM | POA: Diagnosis not present

## 2016-06-22 DIAGNOSIS — I10 Essential (primary) hypertension: Secondary | ICD-10-CM | POA: Diagnosis not present

## 2016-06-22 DIAGNOSIS — I4891 Unspecified atrial fibrillation: Secondary | ICD-10-CM | POA: Diagnosis not present

## 2016-06-22 DIAGNOSIS — R1310 Dysphagia, unspecified: Secondary | ICD-10-CM | POA: Diagnosis not present

## 2016-06-22 DIAGNOSIS — G309 Alzheimer's disease, unspecified: Secondary | ICD-10-CM | POA: Diagnosis not present

## 2016-06-22 DIAGNOSIS — F0281 Dementia in other diseases classified elsewhere with behavioral disturbance: Secondary | ICD-10-CM | POA: Diagnosis not present

## 2016-06-27 DIAGNOSIS — G309 Alzheimer's disease, unspecified: Secondary | ICD-10-CM | POA: Diagnosis not present

## 2016-06-27 DIAGNOSIS — F0281 Dementia in other diseases classified elsewhere with behavioral disturbance: Secondary | ICD-10-CM | POA: Diagnosis not present

## 2016-06-27 DIAGNOSIS — I10 Essential (primary) hypertension: Secondary | ICD-10-CM | POA: Diagnosis not present

## 2016-06-27 DIAGNOSIS — R1310 Dysphagia, unspecified: Secondary | ICD-10-CM | POA: Diagnosis not present

## 2016-06-27 DIAGNOSIS — I4891 Unspecified atrial fibrillation: Secondary | ICD-10-CM | POA: Diagnosis not present

## 2016-06-27 DIAGNOSIS — K219 Gastro-esophageal reflux disease without esophagitis: Secondary | ICD-10-CM | POA: Diagnosis not present

## 2016-06-29 DIAGNOSIS — I10 Essential (primary) hypertension: Secondary | ICD-10-CM | POA: Diagnosis not present

## 2016-06-29 DIAGNOSIS — I4891 Unspecified atrial fibrillation: Secondary | ICD-10-CM | POA: Diagnosis not present

## 2016-06-29 DIAGNOSIS — R1310 Dysphagia, unspecified: Secondary | ICD-10-CM | POA: Diagnosis not present

## 2016-06-29 DIAGNOSIS — G309 Alzheimer's disease, unspecified: Secondary | ICD-10-CM | POA: Diagnosis not present

## 2016-06-29 DIAGNOSIS — F0281 Dementia in other diseases classified elsewhere with behavioral disturbance: Secondary | ICD-10-CM | POA: Diagnosis not present

## 2016-06-29 DIAGNOSIS — K219 Gastro-esophageal reflux disease without esophagitis: Secondary | ICD-10-CM | POA: Diagnosis not present

## 2016-07-03 DIAGNOSIS — K219 Gastro-esophageal reflux disease without esophagitis: Secondary | ICD-10-CM | POA: Diagnosis not present

## 2016-07-03 DIAGNOSIS — F0281 Dementia in other diseases classified elsewhere with behavioral disturbance: Secondary | ICD-10-CM | POA: Diagnosis not present

## 2016-07-03 DIAGNOSIS — I4891 Unspecified atrial fibrillation: Secondary | ICD-10-CM | POA: Diagnosis not present

## 2016-07-03 DIAGNOSIS — I10 Essential (primary) hypertension: Secondary | ICD-10-CM | POA: Diagnosis not present

## 2016-07-03 DIAGNOSIS — R1310 Dysphagia, unspecified: Secondary | ICD-10-CM | POA: Diagnosis not present

## 2016-07-03 DIAGNOSIS — G309 Alzheimer's disease, unspecified: Secondary | ICD-10-CM | POA: Diagnosis not present

## 2016-07-04 DIAGNOSIS — R1310 Dysphagia, unspecified: Secondary | ICD-10-CM | POA: Diagnosis not present

## 2016-07-04 DIAGNOSIS — I4891 Unspecified atrial fibrillation: Secondary | ICD-10-CM | POA: Diagnosis not present

## 2016-07-04 DIAGNOSIS — I10 Essential (primary) hypertension: Secondary | ICD-10-CM | POA: Diagnosis not present

## 2016-07-04 DIAGNOSIS — G309 Alzheimer's disease, unspecified: Secondary | ICD-10-CM | POA: Diagnosis not present

## 2016-07-04 DIAGNOSIS — F0281 Dementia in other diseases classified elsewhere with behavioral disturbance: Secondary | ICD-10-CM | POA: Diagnosis not present

## 2016-07-04 DIAGNOSIS — K219 Gastro-esophageal reflux disease without esophagitis: Secondary | ICD-10-CM | POA: Diagnosis not present

## 2016-07-06 DIAGNOSIS — F0281 Dementia in other diseases classified elsewhere with behavioral disturbance: Secondary | ICD-10-CM | POA: Diagnosis not present

## 2016-07-06 DIAGNOSIS — K219 Gastro-esophageal reflux disease without esophagitis: Secondary | ICD-10-CM | POA: Diagnosis not present

## 2016-07-06 DIAGNOSIS — I4891 Unspecified atrial fibrillation: Secondary | ICD-10-CM | POA: Diagnosis not present

## 2016-07-06 DIAGNOSIS — R1310 Dysphagia, unspecified: Secondary | ICD-10-CM | POA: Diagnosis not present

## 2016-07-06 DIAGNOSIS — I10 Essential (primary) hypertension: Secondary | ICD-10-CM | POA: Diagnosis not present

## 2016-07-06 DIAGNOSIS — G309 Alzheimer's disease, unspecified: Secondary | ICD-10-CM | POA: Diagnosis not present

## 2016-07-07 DIAGNOSIS — R1310 Dysphagia, unspecified: Secondary | ICD-10-CM | POA: Diagnosis not present

## 2016-07-07 DIAGNOSIS — I10 Essential (primary) hypertension: Secondary | ICD-10-CM | POA: Diagnosis not present

## 2016-07-07 DIAGNOSIS — K219 Gastro-esophageal reflux disease without esophagitis: Secondary | ICD-10-CM | POA: Diagnosis not present

## 2016-07-07 DIAGNOSIS — G309 Alzheimer's disease, unspecified: Secondary | ICD-10-CM | POA: Diagnosis not present

## 2016-07-07 DIAGNOSIS — I4891 Unspecified atrial fibrillation: Secondary | ICD-10-CM | POA: Diagnosis not present

## 2016-07-07 DIAGNOSIS — F0281 Dementia in other diseases classified elsewhere with behavioral disturbance: Secondary | ICD-10-CM | POA: Diagnosis not present

## 2016-07-11 DIAGNOSIS — R1310 Dysphagia, unspecified: Secondary | ICD-10-CM | POA: Diagnosis not present

## 2016-07-11 DIAGNOSIS — F0281 Dementia in other diseases classified elsewhere with behavioral disturbance: Secondary | ICD-10-CM | POA: Diagnosis not present

## 2016-07-11 DIAGNOSIS — I4891 Unspecified atrial fibrillation: Secondary | ICD-10-CM | POA: Diagnosis not present

## 2016-07-11 DIAGNOSIS — I10 Essential (primary) hypertension: Secondary | ICD-10-CM | POA: Diagnosis not present

## 2016-07-11 DIAGNOSIS — G309 Alzheimer's disease, unspecified: Secondary | ICD-10-CM | POA: Diagnosis not present

## 2016-07-11 DIAGNOSIS — K219 Gastro-esophageal reflux disease without esophagitis: Secondary | ICD-10-CM | POA: Diagnosis not present

## 2016-07-12 DIAGNOSIS — I10 Essential (primary) hypertension: Secondary | ICD-10-CM | POA: Diagnosis not present

## 2016-07-12 DIAGNOSIS — R1310 Dysphagia, unspecified: Secondary | ICD-10-CM | POA: Diagnosis not present

## 2016-07-12 DIAGNOSIS — F0281 Dementia in other diseases classified elsewhere with behavioral disturbance: Secondary | ICD-10-CM | POA: Diagnosis not present

## 2016-07-12 DIAGNOSIS — G309 Alzheimer's disease, unspecified: Secondary | ICD-10-CM | POA: Diagnosis not present

## 2016-07-12 DIAGNOSIS — K219 Gastro-esophageal reflux disease without esophagitis: Secondary | ICD-10-CM | POA: Diagnosis not present

## 2016-07-12 DIAGNOSIS — I4891 Unspecified atrial fibrillation: Secondary | ICD-10-CM | POA: Diagnosis not present

## 2016-07-13 DIAGNOSIS — I4891 Unspecified atrial fibrillation: Secondary | ICD-10-CM | POA: Diagnosis not present

## 2016-07-13 DIAGNOSIS — I10 Essential (primary) hypertension: Secondary | ICD-10-CM | POA: Diagnosis not present

## 2016-07-13 DIAGNOSIS — K219 Gastro-esophageal reflux disease without esophagitis: Secondary | ICD-10-CM | POA: Diagnosis not present

## 2016-07-13 DIAGNOSIS — F0281 Dementia in other diseases classified elsewhere with behavioral disturbance: Secondary | ICD-10-CM | POA: Diagnosis not present

## 2016-07-13 DIAGNOSIS — G309 Alzheimer's disease, unspecified: Secondary | ICD-10-CM | POA: Diagnosis not present

## 2016-07-13 DIAGNOSIS — R1310 Dysphagia, unspecified: Secondary | ICD-10-CM | POA: Diagnosis not present

## 2016-07-14 DIAGNOSIS — K219 Gastro-esophageal reflux disease without esophagitis: Secondary | ICD-10-CM | POA: Diagnosis not present

## 2016-07-14 DIAGNOSIS — I4891 Unspecified atrial fibrillation: Secondary | ICD-10-CM | POA: Diagnosis not present

## 2016-07-14 DIAGNOSIS — G309 Alzheimer's disease, unspecified: Secondary | ICD-10-CM | POA: Diagnosis not present

## 2016-07-14 DIAGNOSIS — I10 Essential (primary) hypertension: Secondary | ICD-10-CM | POA: Diagnosis not present

## 2016-07-14 DIAGNOSIS — F0281 Dementia in other diseases classified elsewhere with behavioral disturbance: Secondary | ICD-10-CM | POA: Diagnosis not present

## 2016-07-14 DIAGNOSIS — R1310 Dysphagia, unspecified: Secondary | ICD-10-CM | POA: Diagnosis not present

## 2016-07-17 DIAGNOSIS — I4891 Unspecified atrial fibrillation: Secondary | ICD-10-CM | POA: Diagnosis not present

## 2016-07-17 DIAGNOSIS — F0281 Dementia in other diseases classified elsewhere with behavioral disturbance: Secondary | ICD-10-CM | POA: Diagnosis not present

## 2016-07-17 DIAGNOSIS — I10 Essential (primary) hypertension: Secondary | ICD-10-CM | POA: Diagnosis not present

## 2016-07-17 DIAGNOSIS — G309 Alzheimer's disease, unspecified: Secondary | ICD-10-CM | POA: Diagnosis not present

## 2016-07-17 DIAGNOSIS — K219 Gastro-esophageal reflux disease without esophagitis: Secondary | ICD-10-CM | POA: Diagnosis not present

## 2016-07-17 DIAGNOSIS — R1310 Dysphagia, unspecified: Secondary | ICD-10-CM | POA: Diagnosis not present

## 2016-07-18 DIAGNOSIS — R630 Anorexia: Secondary | ICD-10-CM | POA: Diagnosis not present

## 2016-07-18 DIAGNOSIS — E039 Hypothyroidism, unspecified: Secondary | ICD-10-CM | POA: Diagnosis not present

## 2016-07-18 DIAGNOSIS — I10 Essential (primary) hypertension: Secondary | ICD-10-CM | POA: Diagnosis not present

## 2016-07-18 DIAGNOSIS — I4891 Unspecified atrial fibrillation: Secondary | ICD-10-CM | POA: Diagnosis not present

## 2016-07-18 DIAGNOSIS — N390 Urinary tract infection, site not specified: Secondary | ICD-10-CM | POA: Diagnosis not present

## 2016-07-18 DIAGNOSIS — R1310 Dysphagia, unspecified: Secondary | ICD-10-CM | POA: Diagnosis not present

## 2016-07-18 DIAGNOSIS — K219 Gastro-esophageal reflux disease without esophagitis: Secondary | ICD-10-CM | POA: Diagnosis not present

## 2016-07-18 DIAGNOSIS — G309 Alzheimer's disease, unspecified: Secondary | ICD-10-CM | POA: Diagnosis not present

## 2016-07-18 DIAGNOSIS — H409 Unspecified glaucoma: Secondary | ICD-10-CM | POA: Diagnosis not present

## 2016-07-18 DIAGNOSIS — R634 Abnormal weight loss: Secondary | ICD-10-CM | POA: Diagnosis not present

## 2016-07-18 DIAGNOSIS — F0281 Dementia in other diseases classified elsewhere with behavioral disturbance: Secondary | ICD-10-CM | POA: Diagnosis not present

## 2016-07-20 DIAGNOSIS — K219 Gastro-esophageal reflux disease without esophagitis: Secondary | ICD-10-CM | POA: Diagnosis not present

## 2016-07-20 DIAGNOSIS — R1310 Dysphagia, unspecified: Secondary | ICD-10-CM | POA: Diagnosis not present

## 2016-07-20 DIAGNOSIS — F0281 Dementia in other diseases classified elsewhere with behavioral disturbance: Secondary | ICD-10-CM | POA: Diagnosis not present

## 2016-07-20 DIAGNOSIS — I4891 Unspecified atrial fibrillation: Secondary | ICD-10-CM | POA: Diagnosis not present

## 2016-07-20 DIAGNOSIS — I10 Essential (primary) hypertension: Secondary | ICD-10-CM | POA: Diagnosis not present

## 2016-07-20 DIAGNOSIS — G309 Alzheimer's disease, unspecified: Secondary | ICD-10-CM | POA: Diagnosis not present

## 2016-07-25 DIAGNOSIS — G309 Alzheimer's disease, unspecified: Secondary | ICD-10-CM | POA: Diagnosis not present

## 2016-07-25 DIAGNOSIS — I4891 Unspecified atrial fibrillation: Secondary | ICD-10-CM | POA: Diagnosis not present

## 2016-07-25 DIAGNOSIS — K219 Gastro-esophageal reflux disease without esophagitis: Secondary | ICD-10-CM | POA: Diagnosis not present

## 2016-07-25 DIAGNOSIS — I10 Essential (primary) hypertension: Secondary | ICD-10-CM | POA: Diagnosis not present

## 2016-07-25 DIAGNOSIS — F0281 Dementia in other diseases classified elsewhere with behavioral disturbance: Secondary | ICD-10-CM | POA: Diagnosis not present

## 2016-07-25 DIAGNOSIS — R1310 Dysphagia, unspecified: Secondary | ICD-10-CM | POA: Diagnosis not present

## 2016-07-26 DIAGNOSIS — G309 Alzheimer's disease, unspecified: Secondary | ICD-10-CM | POA: Diagnosis not present

## 2016-07-26 DIAGNOSIS — I10 Essential (primary) hypertension: Secondary | ICD-10-CM | POA: Diagnosis not present

## 2016-07-26 DIAGNOSIS — K219 Gastro-esophageal reflux disease without esophagitis: Secondary | ICD-10-CM | POA: Diagnosis not present

## 2016-07-26 DIAGNOSIS — F0281 Dementia in other diseases classified elsewhere with behavioral disturbance: Secondary | ICD-10-CM | POA: Diagnosis not present

## 2016-07-26 DIAGNOSIS — R1310 Dysphagia, unspecified: Secondary | ICD-10-CM | POA: Diagnosis not present

## 2016-07-26 DIAGNOSIS — I4891 Unspecified atrial fibrillation: Secondary | ICD-10-CM | POA: Diagnosis not present

## 2016-07-27 DIAGNOSIS — R1310 Dysphagia, unspecified: Secondary | ICD-10-CM | POA: Diagnosis not present

## 2016-07-27 DIAGNOSIS — F0281 Dementia in other diseases classified elsewhere with behavioral disturbance: Secondary | ICD-10-CM | POA: Diagnosis not present

## 2016-07-27 DIAGNOSIS — I4891 Unspecified atrial fibrillation: Secondary | ICD-10-CM | POA: Diagnosis not present

## 2016-07-27 DIAGNOSIS — G309 Alzheimer's disease, unspecified: Secondary | ICD-10-CM | POA: Diagnosis not present

## 2016-07-27 DIAGNOSIS — K219 Gastro-esophageal reflux disease without esophagitis: Secondary | ICD-10-CM | POA: Diagnosis not present

## 2016-07-27 DIAGNOSIS — I10 Essential (primary) hypertension: Secondary | ICD-10-CM | POA: Diagnosis not present

## 2016-07-28 DIAGNOSIS — K219 Gastro-esophageal reflux disease without esophagitis: Secondary | ICD-10-CM | POA: Diagnosis not present

## 2016-07-28 DIAGNOSIS — I10 Essential (primary) hypertension: Secondary | ICD-10-CM | POA: Diagnosis not present

## 2016-07-28 DIAGNOSIS — F0281 Dementia in other diseases classified elsewhere with behavioral disturbance: Secondary | ICD-10-CM | POA: Diagnosis not present

## 2016-07-28 DIAGNOSIS — I4891 Unspecified atrial fibrillation: Secondary | ICD-10-CM | POA: Diagnosis not present

## 2016-07-28 DIAGNOSIS — G309 Alzheimer's disease, unspecified: Secondary | ICD-10-CM | POA: Diagnosis not present

## 2016-07-28 DIAGNOSIS — R1310 Dysphagia, unspecified: Secondary | ICD-10-CM | POA: Diagnosis not present

## 2016-07-31 DIAGNOSIS — K219 Gastro-esophageal reflux disease without esophagitis: Secondary | ICD-10-CM | POA: Diagnosis not present

## 2016-07-31 DIAGNOSIS — R1310 Dysphagia, unspecified: Secondary | ICD-10-CM | POA: Diagnosis not present

## 2016-07-31 DIAGNOSIS — F0281 Dementia in other diseases classified elsewhere with behavioral disturbance: Secondary | ICD-10-CM | POA: Diagnosis not present

## 2016-07-31 DIAGNOSIS — G309 Alzheimer's disease, unspecified: Secondary | ICD-10-CM | POA: Diagnosis not present

## 2016-07-31 DIAGNOSIS — I4891 Unspecified atrial fibrillation: Secondary | ICD-10-CM | POA: Diagnosis not present

## 2016-07-31 DIAGNOSIS — I10 Essential (primary) hypertension: Secondary | ICD-10-CM | POA: Diagnosis not present

## 2016-08-01 DIAGNOSIS — I10 Essential (primary) hypertension: Secondary | ICD-10-CM | POA: Diagnosis not present

## 2016-08-01 DIAGNOSIS — K219 Gastro-esophageal reflux disease without esophagitis: Secondary | ICD-10-CM | POA: Diagnosis not present

## 2016-08-01 DIAGNOSIS — I4891 Unspecified atrial fibrillation: Secondary | ICD-10-CM | POA: Diagnosis not present

## 2016-08-01 DIAGNOSIS — F0281 Dementia in other diseases classified elsewhere with behavioral disturbance: Secondary | ICD-10-CM | POA: Diagnosis not present

## 2016-08-01 DIAGNOSIS — G309 Alzheimer's disease, unspecified: Secondary | ICD-10-CM | POA: Diagnosis not present

## 2016-08-01 DIAGNOSIS — R1310 Dysphagia, unspecified: Secondary | ICD-10-CM | POA: Diagnosis not present

## 2016-08-02 DIAGNOSIS — B351 Tinea unguium: Secondary | ICD-10-CM | POA: Diagnosis not present

## 2016-08-02 DIAGNOSIS — K219 Gastro-esophageal reflux disease without esophagitis: Secondary | ICD-10-CM | POA: Diagnosis not present

## 2016-08-02 DIAGNOSIS — L603 Nail dystrophy: Secondary | ICD-10-CM | POA: Diagnosis not present

## 2016-08-02 DIAGNOSIS — R1310 Dysphagia, unspecified: Secondary | ICD-10-CM | POA: Diagnosis not present

## 2016-08-02 DIAGNOSIS — F0281 Dementia in other diseases classified elsewhere with behavioral disturbance: Secondary | ICD-10-CM | POA: Diagnosis not present

## 2016-08-02 DIAGNOSIS — G309 Alzheimer's disease, unspecified: Secondary | ICD-10-CM | POA: Diagnosis not present

## 2016-08-02 DIAGNOSIS — M2010 Hallux valgus (acquired), unspecified foot: Secondary | ICD-10-CM | POA: Diagnosis not present

## 2016-08-02 DIAGNOSIS — L853 Xerosis cutis: Secondary | ICD-10-CM | POA: Diagnosis not present

## 2016-08-02 DIAGNOSIS — I10 Essential (primary) hypertension: Secondary | ICD-10-CM | POA: Diagnosis not present

## 2016-08-02 DIAGNOSIS — I4891 Unspecified atrial fibrillation: Secondary | ICD-10-CM | POA: Diagnosis not present

## 2016-08-02 DIAGNOSIS — Q845 Enlarged and hypertrophic nails: Secondary | ICD-10-CM | POA: Diagnosis not present

## 2016-08-02 DIAGNOSIS — I739 Peripheral vascular disease, unspecified: Secondary | ICD-10-CM | POA: Diagnosis not present

## 2016-08-03 DIAGNOSIS — R1310 Dysphagia, unspecified: Secondary | ICD-10-CM | POA: Diagnosis not present

## 2016-08-03 DIAGNOSIS — G309 Alzheimer's disease, unspecified: Secondary | ICD-10-CM | POA: Diagnosis not present

## 2016-08-03 DIAGNOSIS — I10 Essential (primary) hypertension: Secondary | ICD-10-CM | POA: Diagnosis not present

## 2016-08-03 DIAGNOSIS — K219 Gastro-esophageal reflux disease without esophagitis: Secondary | ICD-10-CM | POA: Diagnosis not present

## 2016-08-03 DIAGNOSIS — F0281 Dementia in other diseases classified elsewhere with behavioral disturbance: Secondary | ICD-10-CM | POA: Diagnosis not present

## 2016-08-03 DIAGNOSIS — I4891 Unspecified atrial fibrillation: Secondary | ICD-10-CM | POA: Diagnosis not present

## 2016-08-07 DIAGNOSIS — I10 Essential (primary) hypertension: Secondary | ICD-10-CM | POA: Diagnosis not present

## 2016-08-07 DIAGNOSIS — F0281 Dementia in other diseases classified elsewhere with behavioral disturbance: Secondary | ICD-10-CM | POA: Diagnosis not present

## 2016-08-07 DIAGNOSIS — R1310 Dysphagia, unspecified: Secondary | ICD-10-CM | POA: Diagnosis not present

## 2016-08-07 DIAGNOSIS — K219 Gastro-esophageal reflux disease without esophagitis: Secondary | ICD-10-CM | POA: Diagnosis not present

## 2016-08-07 DIAGNOSIS — I4891 Unspecified atrial fibrillation: Secondary | ICD-10-CM | POA: Diagnosis not present

## 2016-08-07 DIAGNOSIS — G309 Alzheimer's disease, unspecified: Secondary | ICD-10-CM | POA: Diagnosis not present

## 2016-08-08 DIAGNOSIS — R1310 Dysphagia, unspecified: Secondary | ICD-10-CM | POA: Diagnosis not present

## 2016-08-08 DIAGNOSIS — G309 Alzheimer's disease, unspecified: Secondary | ICD-10-CM | POA: Diagnosis not present

## 2016-08-08 DIAGNOSIS — I4891 Unspecified atrial fibrillation: Secondary | ICD-10-CM | POA: Diagnosis not present

## 2016-08-08 DIAGNOSIS — I10 Essential (primary) hypertension: Secondary | ICD-10-CM | POA: Diagnosis not present

## 2016-08-08 DIAGNOSIS — F0281 Dementia in other diseases classified elsewhere with behavioral disturbance: Secondary | ICD-10-CM | POA: Diagnosis not present

## 2016-08-08 DIAGNOSIS — K219 Gastro-esophageal reflux disease without esophagitis: Secondary | ICD-10-CM | POA: Diagnosis not present

## 2016-08-09 DIAGNOSIS — G309 Alzheimer's disease, unspecified: Secondary | ICD-10-CM | POA: Diagnosis not present

## 2016-08-09 DIAGNOSIS — I4891 Unspecified atrial fibrillation: Secondary | ICD-10-CM | POA: Diagnosis not present

## 2016-08-09 DIAGNOSIS — K219 Gastro-esophageal reflux disease without esophagitis: Secondary | ICD-10-CM | POA: Diagnosis not present

## 2016-08-09 DIAGNOSIS — R1310 Dysphagia, unspecified: Secondary | ICD-10-CM | POA: Diagnosis not present

## 2016-08-09 DIAGNOSIS — I10 Essential (primary) hypertension: Secondary | ICD-10-CM | POA: Diagnosis not present

## 2016-08-09 DIAGNOSIS — F0281 Dementia in other diseases classified elsewhere with behavioral disturbance: Secondary | ICD-10-CM | POA: Diagnosis not present

## 2016-08-10 DIAGNOSIS — I10 Essential (primary) hypertension: Secondary | ICD-10-CM | POA: Diagnosis not present

## 2016-08-10 DIAGNOSIS — K219 Gastro-esophageal reflux disease without esophagitis: Secondary | ICD-10-CM | POA: Diagnosis not present

## 2016-08-10 DIAGNOSIS — F0281 Dementia in other diseases classified elsewhere with behavioral disturbance: Secondary | ICD-10-CM | POA: Diagnosis not present

## 2016-08-10 DIAGNOSIS — G309 Alzheimer's disease, unspecified: Secondary | ICD-10-CM | POA: Diagnosis not present

## 2016-08-10 DIAGNOSIS — I4891 Unspecified atrial fibrillation: Secondary | ICD-10-CM | POA: Diagnosis not present

## 2016-08-10 DIAGNOSIS — R1310 Dysphagia, unspecified: Secondary | ICD-10-CM | POA: Diagnosis not present

## 2016-08-13 DIAGNOSIS — F0281 Dementia in other diseases classified elsewhere with behavioral disturbance: Secondary | ICD-10-CM | POA: Diagnosis not present

## 2016-08-13 DIAGNOSIS — K219 Gastro-esophageal reflux disease without esophagitis: Secondary | ICD-10-CM | POA: Diagnosis not present

## 2016-08-13 DIAGNOSIS — R1310 Dysphagia, unspecified: Secondary | ICD-10-CM | POA: Diagnosis not present

## 2016-08-13 DIAGNOSIS — G309 Alzheimer's disease, unspecified: Secondary | ICD-10-CM | POA: Diagnosis not present

## 2016-08-13 DIAGNOSIS — I10 Essential (primary) hypertension: Secondary | ICD-10-CM | POA: Diagnosis not present

## 2016-08-13 DIAGNOSIS — I4891 Unspecified atrial fibrillation: Secondary | ICD-10-CM | POA: Diagnosis not present

## 2016-08-15 DIAGNOSIS — F0281 Dementia in other diseases classified elsewhere with behavioral disturbance: Secondary | ICD-10-CM | POA: Diagnosis not present

## 2016-08-15 DIAGNOSIS — I10 Essential (primary) hypertension: Secondary | ICD-10-CM | POA: Diagnosis not present

## 2016-08-15 DIAGNOSIS — K219 Gastro-esophageal reflux disease without esophagitis: Secondary | ICD-10-CM | POA: Diagnosis not present

## 2016-08-15 DIAGNOSIS — G309 Alzheimer's disease, unspecified: Secondary | ICD-10-CM | POA: Diagnosis not present

## 2016-08-15 DIAGNOSIS — I4891 Unspecified atrial fibrillation: Secondary | ICD-10-CM | POA: Diagnosis not present

## 2016-08-15 DIAGNOSIS — R1310 Dysphagia, unspecified: Secondary | ICD-10-CM | POA: Diagnosis not present

## 2016-08-17 DIAGNOSIS — F0281 Dementia in other diseases classified elsewhere with behavioral disturbance: Secondary | ICD-10-CM | POA: Diagnosis not present

## 2016-08-17 DIAGNOSIS — R1310 Dysphagia, unspecified: Secondary | ICD-10-CM | POA: Diagnosis not present

## 2016-08-17 DIAGNOSIS — I10 Essential (primary) hypertension: Secondary | ICD-10-CM | POA: Diagnosis not present

## 2016-08-17 DIAGNOSIS — G309 Alzheimer's disease, unspecified: Secondary | ICD-10-CM | POA: Diagnosis not present

## 2016-08-17 DIAGNOSIS — K219 Gastro-esophageal reflux disease without esophagitis: Secondary | ICD-10-CM | POA: Diagnosis not present

## 2016-08-17 DIAGNOSIS — I4891 Unspecified atrial fibrillation: Secondary | ICD-10-CM | POA: Diagnosis not present

## 2016-08-18 DIAGNOSIS — K219 Gastro-esophageal reflux disease without esophagitis: Secondary | ICD-10-CM | POA: Diagnosis not present

## 2016-08-18 DIAGNOSIS — I4891 Unspecified atrial fibrillation: Secondary | ICD-10-CM | POA: Diagnosis not present

## 2016-08-18 DIAGNOSIS — I10 Essential (primary) hypertension: Secondary | ICD-10-CM | POA: Diagnosis not present

## 2016-08-18 DIAGNOSIS — R630 Anorexia: Secondary | ICD-10-CM | POA: Diagnosis not present

## 2016-08-18 DIAGNOSIS — F0281 Dementia in other diseases classified elsewhere with behavioral disturbance: Secondary | ICD-10-CM | POA: Diagnosis not present

## 2016-08-18 DIAGNOSIS — E039 Hypothyroidism, unspecified: Secondary | ICD-10-CM | POA: Diagnosis not present

## 2016-08-18 DIAGNOSIS — R1310 Dysphagia, unspecified: Secondary | ICD-10-CM | POA: Diagnosis not present

## 2016-08-18 DIAGNOSIS — R634 Abnormal weight loss: Secondary | ICD-10-CM | POA: Diagnosis not present

## 2016-08-18 DIAGNOSIS — H409 Unspecified glaucoma: Secondary | ICD-10-CM | POA: Diagnosis not present

## 2016-08-18 DIAGNOSIS — G309 Alzheimer's disease, unspecified: Secondary | ICD-10-CM | POA: Diagnosis not present

## 2016-08-22 DIAGNOSIS — G309 Alzheimer's disease, unspecified: Secondary | ICD-10-CM | POA: Diagnosis not present

## 2016-08-22 DIAGNOSIS — R1310 Dysphagia, unspecified: Secondary | ICD-10-CM | POA: Diagnosis not present

## 2016-08-22 DIAGNOSIS — K219 Gastro-esophageal reflux disease without esophagitis: Secondary | ICD-10-CM | POA: Diagnosis not present

## 2016-08-22 DIAGNOSIS — F0281 Dementia in other diseases classified elsewhere with behavioral disturbance: Secondary | ICD-10-CM | POA: Diagnosis not present

## 2016-08-22 DIAGNOSIS — I4891 Unspecified atrial fibrillation: Secondary | ICD-10-CM | POA: Diagnosis not present

## 2016-08-22 DIAGNOSIS — I10 Essential (primary) hypertension: Secondary | ICD-10-CM | POA: Diagnosis not present

## 2016-08-23 DIAGNOSIS — R1310 Dysphagia, unspecified: Secondary | ICD-10-CM | POA: Diagnosis not present

## 2016-08-23 DIAGNOSIS — F0281 Dementia in other diseases classified elsewhere with behavioral disturbance: Secondary | ICD-10-CM | POA: Diagnosis not present

## 2016-08-23 DIAGNOSIS — I4891 Unspecified atrial fibrillation: Secondary | ICD-10-CM | POA: Diagnosis not present

## 2016-08-23 DIAGNOSIS — K219 Gastro-esophageal reflux disease without esophagitis: Secondary | ICD-10-CM | POA: Diagnosis not present

## 2016-08-23 DIAGNOSIS — G309 Alzheimer's disease, unspecified: Secondary | ICD-10-CM | POA: Diagnosis not present

## 2016-08-23 DIAGNOSIS — I10 Essential (primary) hypertension: Secondary | ICD-10-CM | POA: Diagnosis not present

## 2016-08-24 DIAGNOSIS — G309 Alzheimer's disease, unspecified: Secondary | ICD-10-CM | POA: Diagnosis not present

## 2016-08-24 DIAGNOSIS — K219 Gastro-esophageal reflux disease without esophagitis: Secondary | ICD-10-CM | POA: Diagnosis not present

## 2016-08-24 DIAGNOSIS — I4891 Unspecified atrial fibrillation: Secondary | ICD-10-CM | POA: Diagnosis not present

## 2016-08-24 DIAGNOSIS — R1310 Dysphagia, unspecified: Secondary | ICD-10-CM | POA: Diagnosis not present

## 2016-08-24 DIAGNOSIS — F0281 Dementia in other diseases classified elsewhere with behavioral disturbance: Secondary | ICD-10-CM | POA: Diagnosis not present

## 2016-08-24 DIAGNOSIS — I10 Essential (primary) hypertension: Secondary | ICD-10-CM | POA: Diagnosis not present

## 2016-08-25 DIAGNOSIS — I4891 Unspecified atrial fibrillation: Secondary | ICD-10-CM | POA: Diagnosis not present

## 2016-08-25 DIAGNOSIS — I10 Essential (primary) hypertension: Secondary | ICD-10-CM | POA: Diagnosis not present

## 2016-08-25 DIAGNOSIS — K219 Gastro-esophageal reflux disease without esophagitis: Secondary | ICD-10-CM | POA: Diagnosis not present

## 2016-08-25 DIAGNOSIS — F0281 Dementia in other diseases classified elsewhere with behavioral disturbance: Secondary | ICD-10-CM | POA: Diagnosis not present

## 2016-08-25 DIAGNOSIS — R1310 Dysphagia, unspecified: Secondary | ICD-10-CM | POA: Diagnosis not present

## 2016-08-25 DIAGNOSIS — G309 Alzheimer's disease, unspecified: Secondary | ICD-10-CM | POA: Diagnosis not present

## 2016-08-28 DIAGNOSIS — K219 Gastro-esophageal reflux disease without esophagitis: Secondary | ICD-10-CM | POA: Diagnosis not present

## 2016-08-28 DIAGNOSIS — I4891 Unspecified atrial fibrillation: Secondary | ICD-10-CM | POA: Diagnosis not present

## 2016-08-28 DIAGNOSIS — G309 Alzheimer's disease, unspecified: Secondary | ICD-10-CM | POA: Diagnosis not present

## 2016-08-28 DIAGNOSIS — F0281 Dementia in other diseases classified elsewhere with behavioral disturbance: Secondary | ICD-10-CM | POA: Diagnosis not present

## 2016-08-28 DIAGNOSIS — I10 Essential (primary) hypertension: Secondary | ICD-10-CM | POA: Diagnosis not present

## 2016-08-28 DIAGNOSIS — R1310 Dysphagia, unspecified: Secondary | ICD-10-CM | POA: Diagnosis not present

## 2016-08-29 DIAGNOSIS — G309 Alzheimer's disease, unspecified: Secondary | ICD-10-CM | POA: Diagnosis not present

## 2016-08-29 DIAGNOSIS — I10 Essential (primary) hypertension: Secondary | ICD-10-CM | POA: Diagnosis not present

## 2016-08-29 DIAGNOSIS — K219 Gastro-esophageal reflux disease without esophagitis: Secondary | ICD-10-CM | POA: Diagnosis not present

## 2016-08-29 DIAGNOSIS — F0281 Dementia in other diseases classified elsewhere with behavioral disturbance: Secondary | ICD-10-CM | POA: Diagnosis not present

## 2016-08-29 DIAGNOSIS — I4891 Unspecified atrial fibrillation: Secondary | ICD-10-CM | POA: Diagnosis not present

## 2016-08-29 DIAGNOSIS — R1310 Dysphagia, unspecified: Secondary | ICD-10-CM | POA: Diagnosis not present

## 2016-08-31 DIAGNOSIS — F0281 Dementia in other diseases classified elsewhere with behavioral disturbance: Secondary | ICD-10-CM | POA: Diagnosis not present

## 2016-08-31 DIAGNOSIS — K219 Gastro-esophageal reflux disease without esophagitis: Secondary | ICD-10-CM | POA: Diagnosis not present

## 2016-08-31 DIAGNOSIS — G309 Alzheimer's disease, unspecified: Secondary | ICD-10-CM | POA: Diagnosis not present

## 2016-08-31 DIAGNOSIS — I10 Essential (primary) hypertension: Secondary | ICD-10-CM | POA: Diagnosis not present

## 2016-08-31 DIAGNOSIS — R1310 Dysphagia, unspecified: Secondary | ICD-10-CM | POA: Diagnosis not present

## 2016-08-31 DIAGNOSIS — I4891 Unspecified atrial fibrillation: Secondary | ICD-10-CM | POA: Diagnosis not present

## 2016-09-05 DIAGNOSIS — R1310 Dysphagia, unspecified: Secondary | ICD-10-CM | POA: Diagnosis not present

## 2016-09-05 DIAGNOSIS — I10 Essential (primary) hypertension: Secondary | ICD-10-CM | POA: Diagnosis not present

## 2016-09-05 DIAGNOSIS — I4891 Unspecified atrial fibrillation: Secondary | ICD-10-CM | POA: Diagnosis not present

## 2016-09-05 DIAGNOSIS — G309 Alzheimer's disease, unspecified: Secondary | ICD-10-CM | POA: Diagnosis not present

## 2016-09-05 DIAGNOSIS — K219 Gastro-esophageal reflux disease without esophagitis: Secondary | ICD-10-CM | POA: Diagnosis not present

## 2016-09-05 DIAGNOSIS — F0281 Dementia in other diseases classified elsewhere with behavioral disturbance: Secondary | ICD-10-CM | POA: Diagnosis not present

## 2016-09-06 DIAGNOSIS — R1310 Dysphagia, unspecified: Secondary | ICD-10-CM | POA: Diagnosis not present

## 2016-09-06 DIAGNOSIS — F0281 Dementia in other diseases classified elsewhere with behavioral disturbance: Secondary | ICD-10-CM | POA: Diagnosis not present

## 2016-09-06 DIAGNOSIS — G309 Alzheimer's disease, unspecified: Secondary | ICD-10-CM | POA: Diagnosis not present

## 2016-09-06 DIAGNOSIS — K219 Gastro-esophageal reflux disease without esophagitis: Secondary | ICD-10-CM | POA: Diagnosis not present

## 2016-09-06 DIAGNOSIS — I10 Essential (primary) hypertension: Secondary | ICD-10-CM | POA: Diagnosis not present

## 2016-09-06 DIAGNOSIS — I4891 Unspecified atrial fibrillation: Secondary | ICD-10-CM | POA: Diagnosis not present

## 2016-09-08 DIAGNOSIS — G309 Alzheimer's disease, unspecified: Secondary | ICD-10-CM | POA: Diagnosis not present

## 2016-09-08 DIAGNOSIS — I4891 Unspecified atrial fibrillation: Secondary | ICD-10-CM | POA: Diagnosis not present

## 2016-09-08 DIAGNOSIS — I10 Essential (primary) hypertension: Secondary | ICD-10-CM | POA: Diagnosis not present

## 2016-09-08 DIAGNOSIS — F0281 Dementia in other diseases classified elsewhere with behavioral disturbance: Secondary | ICD-10-CM | POA: Diagnosis not present

## 2016-09-08 DIAGNOSIS — K219 Gastro-esophageal reflux disease without esophagitis: Secondary | ICD-10-CM | POA: Diagnosis not present

## 2016-09-08 DIAGNOSIS — R1310 Dysphagia, unspecified: Secondary | ICD-10-CM | POA: Diagnosis not present

## 2016-09-11 DIAGNOSIS — I4891 Unspecified atrial fibrillation: Secondary | ICD-10-CM | POA: Diagnosis not present

## 2016-09-11 DIAGNOSIS — K219 Gastro-esophageal reflux disease without esophagitis: Secondary | ICD-10-CM | POA: Diagnosis not present

## 2016-09-11 DIAGNOSIS — F0281 Dementia in other diseases classified elsewhere with behavioral disturbance: Secondary | ICD-10-CM | POA: Diagnosis not present

## 2016-09-11 DIAGNOSIS — R1310 Dysphagia, unspecified: Secondary | ICD-10-CM | POA: Diagnosis not present

## 2016-09-11 DIAGNOSIS — G309 Alzheimer's disease, unspecified: Secondary | ICD-10-CM | POA: Diagnosis not present

## 2016-09-11 DIAGNOSIS — I10 Essential (primary) hypertension: Secondary | ICD-10-CM | POA: Diagnosis not present

## 2016-09-12 DIAGNOSIS — K219 Gastro-esophageal reflux disease without esophagitis: Secondary | ICD-10-CM | POA: Diagnosis not present

## 2016-09-12 DIAGNOSIS — G309 Alzheimer's disease, unspecified: Secondary | ICD-10-CM | POA: Diagnosis not present

## 2016-09-12 DIAGNOSIS — R1310 Dysphagia, unspecified: Secondary | ICD-10-CM | POA: Diagnosis not present

## 2016-09-12 DIAGNOSIS — I4891 Unspecified atrial fibrillation: Secondary | ICD-10-CM | POA: Diagnosis not present

## 2016-09-12 DIAGNOSIS — F0281 Dementia in other diseases classified elsewhere with behavioral disturbance: Secondary | ICD-10-CM | POA: Diagnosis not present

## 2016-09-12 DIAGNOSIS — I10 Essential (primary) hypertension: Secondary | ICD-10-CM | POA: Diagnosis not present

## 2016-09-14 DIAGNOSIS — I10 Essential (primary) hypertension: Secondary | ICD-10-CM | POA: Diagnosis not present

## 2016-09-14 DIAGNOSIS — K219 Gastro-esophageal reflux disease without esophagitis: Secondary | ICD-10-CM | POA: Diagnosis not present

## 2016-09-14 DIAGNOSIS — G309 Alzheimer's disease, unspecified: Secondary | ICD-10-CM | POA: Diagnosis not present

## 2016-09-14 DIAGNOSIS — I4891 Unspecified atrial fibrillation: Secondary | ICD-10-CM | POA: Diagnosis not present

## 2016-09-14 DIAGNOSIS — F0281 Dementia in other diseases classified elsewhere with behavioral disturbance: Secondary | ICD-10-CM | POA: Diagnosis not present

## 2016-09-14 DIAGNOSIS — R1310 Dysphagia, unspecified: Secondary | ICD-10-CM | POA: Diagnosis not present

## 2016-09-15 DIAGNOSIS — R1310 Dysphagia, unspecified: Secondary | ICD-10-CM | POA: Diagnosis not present

## 2016-09-15 DIAGNOSIS — G309 Alzheimer's disease, unspecified: Secondary | ICD-10-CM | POA: Diagnosis not present

## 2016-09-15 DIAGNOSIS — I10 Essential (primary) hypertension: Secondary | ICD-10-CM | POA: Diagnosis not present

## 2016-09-15 DIAGNOSIS — F0281 Dementia in other diseases classified elsewhere with behavioral disturbance: Secondary | ICD-10-CM | POA: Diagnosis not present

## 2016-09-15 DIAGNOSIS — I4891 Unspecified atrial fibrillation: Secondary | ICD-10-CM | POA: Diagnosis not present

## 2016-09-15 DIAGNOSIS — K219 Gastro-esophageal reflux disease without esophagitis: Secondary | ICD-10-CM | POA: Diagnosis not present

## 2016-09-17 DIAGNOSIS — I10 Essential (primary) hypertension: Secondary | ICD-10-CM | POA: Diagnosis not present

## 2016-09-17 DIAGNOSIS — K219 Gastro-esophageal reflux disease without esophagitis: Secondary | ICD-10-CM | POA: Diagnosis not present

## 2016-09-17 DIAGNOSIS — R1310 Dysphagia, unspecified: Secondary | ICD-10-CM | POA: Diagnosis not present

## 2016-09-17 DIAGNOSIS — I4891 Unspecified atrial fibrillation: Secondary | ICD-10-CM | POA: Diagnosis not present

## 2016-09-17 DIAGNOSIS — H409 Unspecified glaucoma: Secondary | ICD-10-CM | POA: Diagnosis not present

## 2016-09-17 DIAGNOSIS — F0281 Dementia in other diseases classified elsewhere with behavioral disturbance: Secondary | ICD-10-CM | POA: Diagnosis not present

## 2016-09-17 DIAGNOSIS — E039 Hypothyroidism, unspecified: Secondary | ICD-10-CM | POA: Diagnosis not present

## 2016-09-17 DIAGNOSIS — R634 Abnormal weight loss: Secondary | ICD-10-CM | POA: Diagnosis not present

## 2016-09-17 DIAGNOSIS — R630 Anorexia: Secondary | ICD-10-CM | POA: Diagnosis not present

## 2016-09-17 DIAGNOSIS — G309 Alzheimer's disease, unspecified: Secondary | ICD-10-CM | POA: Diagnosis not present

## 2016-09-18 DIAGNOSIS — K219 Gastro-esophageal reflux disease without esophagitis: Secondary | ICD-10-CM | POA: Diagnosis not present

## 2016-09-18 DIAGNOSIS — I4891 Unspecified atrial fibrillation: Secondary | ICD-10-CM | POA: Diagnosis not present

## 2016-09-18 DIAGNOSIS — I10 Essential (primary) hypertension: Secondary | ICD-10-CM | POA: Diagnosis not present

## 2016-09-18 DIAGNOSIS — G309 Alzheimer's disease, unspecified: Secondary | ICD-10-CM | POA: Diagnosis not present

## 2016-09-18 DIAGNOSIS — F0281 Dementia in other diseases classified elsewhere with behavioral disturbance: Secondary | ICD-10-CM | POA: Diagnosis not present

## 2016-09-18 DIAGNOSIS — R1310 Dysphagia, unspecified: Secondary | ICD-10-CM | POA: Diagnosis not present

## 2016-09-19 DIAGNOSIS — K219 Gastro-esophageal reflux disease without esophagitis: Secondary | ICD-10-CM | POA: Diagnosis not present

## 2016-09-19 DIAGNOSIS — I10 Essential (primary) hypertension: Secondary | ICD-10-CM | POA: Diagnosis not present

## 2016-09-19 DIAGNOSIS — I4891 Unspecified atrial fibrillation: Secondary | ICD-10-CM | POA: Diagnosis not present

## 2016-09-19 DIAGNOSIS — G309 Alzheimer's disease, unspecified: Secondary | ICD-10-CM | POA: Diagnosis not present

## 2016-09-19 DIAGNOSIS — F0281 Dementia in other diseases classified elsewhere with behavioral disturbance: Secondary | ICD-10-CM | POA: Diagnosis not present

## 2016-09-19 DIAGNOSIS — R1310 Dysphagia, unspecified: Secondary | ICD-10-CM | POA: Diagnosis not present

## 2016-09-21 DIAGNOSIS — I4891 Unspecified atrial fibrillation: Secondary | ICD-10-CM | POA: Diagnosis not present

## 2016-09-21 DIAGNOSIS — F0281 Dementia in other diseases classified elsewhere with behavioral disturbance: Secondary | ICD-10-CM | POA: Diagnosis not present

## 2016-09-21 DIAGNOSIS — I10 Essential (primary) hypertension: Secondary | ICD-10-CM | POA: Diagnosis not present

## 2016-09-21 DIAGNOSIS — R1310 Dysphagia, unspecified: Secondary | ICD-10-CM | POA: Diagnosis not present

## 2016-09-21 DIAGNOSIS — K219 Gastro-esophageal reflux disease without esophagitis: Secondary | ICD-10-CM | POA: Diagnosis not present

## 2016-09-21 DIAGNOSIS — G309 Alzheimer's disease, unspecified: Secondary | ICD-10-CM | POA: Diagnosis not present

## 2016-09-24 DIAGNOSIS — G309 Alzheimer's disease, unspecified: Secondary | ICD-10-CM | POA: Diagnosis not present

## 2016-09-24 DIAGNOSIS — I10 Essential (primary) hypertension: Secondary | ICD-10-CM | POA: Diagnosis not present

## 2016-09-24 DIAGNOSIS — F0281 Dementia in other diseases classified elsewhere with behavioral disturbance: Secondary | ICD-10-CM | POA: Diagnosis not present

## 2016-09-24 DIAGNOSIS — R1310 Dysphagia, unspecified: Secondary | ICD-10-CM | POA: Diagnosis not present

## 2016-09-24 DIAGNOSIS — I4891 Unspecified atrial fibrillation: Secondary | ICD-10-CM | POA: Diagnosis not present

## 2016-09-24 DIAGNOSIS — K219 Gastro-esophageal reflux disease without esophagitis: Secondary | ICD-10-CM | POA: Diagnosis not present

## 2016-09-25 DIAGNOSIS — I4891 Unspecified atrial fibrillation: Secondary | ICD-10-CM | POA: Diagnosis not present

## 2016-09-25 DIAGNOSIS — I10 Essential (primary) hypertension: Secondary | ICD-10-CM | POA: Diagnosis not present

## 2016-09-25 DIAGNOSIS — K219 Gastro-esophageal reflux disease without esophagitis: Secondary | ICD-10-CM | POA: Diagnosis not present

## 2016-09-25 DIAGNOSIS — R1310 Dysphagia, unspecified: Secondary | ICD-10-CM | POA: Diagnosis not present

## 2016-09-25 DIAGNOSIS — G309 Alzheimer's disease, unspecified: Secondary | ICD-10-CM | POA: Diagnosis not present

## 2016-09-25 DIAGNOSIS — F0281 Dementia in other diseases classified elsewhere with behavioral disturbance: Secondary | ICD-10-CM | POA: Diagnosis not present

## 2016-09-26 DIAGNOSIS — F0281 Dementia in other diseases classified elsewhere with behavioral disturbance: Secondary | ICD-10-CM | POA: Diagnosis not present

## 2016-09-26 DIAGNOSIS — I10 Essential (primary) hypertension: Secondary | ICD-10-CM | POA: Diagnosis not present

## 2016-09-26 DIAGNOSIS — R1310 Dysphagia, unspecified: Secondary | ICD-10-CM | POA: Diagnosis not present

## 2016-09-26 DIAGNOSIS — I4891 Unspecified atrial fibrillation: Secondary | ICD-10-CM | POA: Diagnosis not present

## 2016-09-26 DIAGNOSIS — G309 Alzheimer's disease, unspecified: Secondary | ICD-10-CM | POA: Diagnosis not present

## 2016-09-26 DIAGNOSIS — K219 Gastro-esophageal reflux disease without esophagitis: Secondary | ICD-10-CM | POA: Diagnosis not present

## 2016-09-27 DIAGNOSIS — N390 Urinary tract infection, site not specified: Secondary | ICD-10-CM | POA: Diagnosis not present

## 2016-09-28 DIAGNOSIS — K219 Gastro-esophageal reflux disease without esophagitis: Secondary | ICD-10-CM | POA: Diagnosis not present

## 2016-09-28 DIAGNOSIS — I10 Essential (primary) hypertension: Secondary | ICD-10-CM | POA: Diagnosis not present

## 2016-09-28 DIAGNOSIS — R1310 Dysphagia, unspecified: Secondary | ICD-10-CM | POA: Diagnosis not present

## 2016-09-28 DIAGNOSIS — F0281 Dementia in other diseases classified elsewhere with behavioral disturbance: Secondary | ICD-10-CM | POA: Diagnosis not present

## 2016-09-28 DIAGNOSIS — I4891 Unspecified atrial fibrillation: Secondary | ICD-10-CM | POA: Diagnosis not present

## 2016-09-28 DIAGNOSIS — G309 Alzheimer's disease, unspecified: Secondary | ICD-10-CM | POA: Diagnosis not present

## 2016-10-03 DIAGNOSIS — K219 Gastro-esophageal reflux disease without esophagitis: Secondary | ICD-10-CM | POA: Diagnosis not present

## 2016-10-03 DIAGNOSIS — G309 Alzheimer's disease, unspecified: Secondary | ICD-10-CM | POA: Diagnosis not present

## 2016-10-03 DIAGNOSIS — I10 Essential (primary) hypertension: Secondary | ICD-10-CM | POA: Diagnosis not present

## 2016-10-03 DIAGNOSIS — I4891 Unspecified atrial fibrillation: Secondary | ICD-10-CM | POA: Diagnosis not present

## 2016-10-03 DIAGNOSIS — R1310 Dysphagia, unspecified: Secondary | ICD-10-CM | POA: Diagnosis not present

## 2016-10-03 DIAGNOSIS — F0281 Dementia in other diseases classified elsewhere with behavioral disturbance: Secondary | ICD-10-CM | POA: Diagnosis not present

## 2016-10-04 DIAGNOSIS — K219 Gastro-esophageal reflux disease without esophagitis: Secondary | ICD-10-CM | POA: Diagnosis not present

## 2016-10-04 DIAGNOSIS — I10 Essential (primary) hypertension: Secondary | ICD-10-CM | POA: Diagnosis not present

## 2016-10-04 DIAGNOSIS — I4891 Unspecified atrial fibrillation: Secondary | ICD-10-CM | POA: Diagnosis not present

## 2016-10-04 DIAGNOSIS — R1310 Dysphagia, unspecified: Secondary | ICD-10-CM | POA: Diagnosis not present

## 2016-10-04 DIAGNOSIS — G309 Alzheimer's disease, unspecified: Secondary | ICD-10-CM | POA: Diagnosis not present

## 2016-10-04 DIAGNOSIS — F0281 Dementia in other diseases classified elsewhere with behavioral disturbance: Secondary | ICD-10-CM | POA: Diagnosis not present

## 2016-10-05 DIAGNOSIS — I10 Essential (primary) hypertension: Secondary | ICD-10-CM | POA: Diagnosis not present

## 2016-10-05 DIAGNOSIS — F0281 Dementia in other diseases classified elsewhere with behavioral disturbance: Secondary | ICD-10-CM | POA: Diagnosis not present

## 2016-10-05 DIAGNOSIS — K219 Gastro-esophageal reflux disease without esophagitis: Secondary | ICD-10-CM | POA: Diagnosis not present

## 2016-10-05 DIAGNOSIS — R1310 Dysphagia, unspecified: Secondary | ICD-10-CM | POA: Diagnosis not present

## 2016-10-05 DIAGNOSIS — G309 Alzheimer's disease, unspecified: Secondary | ICD-10-CM | POA: Diagnosis not present

## 2016-10-05 DIAGNOSIS — I4891 Unspecified atrial fibrillation: Secondary | ICD-10-CM | POA: Diagnosis not present

## 2016-10-06 DIAGNOSIS — F0281 Dementia in other diseases classified elsewhere with behavioral disturbance: Secondary | ICD-10-CM | POA: Diagnosis not present

## 2016-10-06 DIAGNOSIS — G309 Alzheimer's disease, unspecified: Secondary | ICD-10-CM | POA: Diagnosis not present

## 2016-10-06 DIAGNOSIS — K219 Gastro-esophageal reflux disease without esophagitis: Secondary | ICD-10-CM | POA: Diagnosis not present

## 2016-10-06 DIAGNOSIS — I10 Essential (primary) hypertension: Secondary | ICD-10-CM | POA: Diagnosis not present

## 2016-10-06 DIAGNOSIS — R1310 Dysphagia, unspecified: Secondary | ICD-10-CM | POA: Diagnosis not present

## 2016-10-06 DIAGNOSIS — I4891 Unspecified atrial fibrillation: Secondary | ICD-10-CM | POA: Diagnosis not present

## 2016-10-09 DIAGNOSIS — R1310 Dysphagia, unspecified: Secondary | ICD-10-CM | POA: Diagnosis not present

## 2016-10-09 DIAGNOSIS — I4891 Unspecified atrial fibrillation: Secondary | ICD-10-CM | POA: Diagnosis not present

## 2016-10-09 DIAGNOSIS — F0281 Dementia in other diseases classified elsewhere with behavioral disturbance: Secondary | ICD-10-CM | POA: Diagnosis not present

## 2016-10-09 DIAGNOSIS — I10 Essential (primary) hypertension: Secondary | ICD-10-CM | POA: Diagnosis not present

## 2016-10-09 DIAGNOSIS — K219 Gastro-esophageal reflux disease without esophagitis: Secondary | ICD-10-CM | POA: Diagnosis not present

## 2016-10-09 DIAGNOSIS — G309 Alzheimer's disease, unspecified: Secondary | ICD-10-CM | POA: Diagnosis not present

## 2016-10-10 DIAGNOSIS — I4891 Unspecified atrial fibrillation: Secondary | ICD-10-CM | POA: Diagnosis not present

## 2016-10-10 DIAGNOSIS — I10 Essential (primary) hypertension: Secondary | ICD-10-CM | POA: Diagnosis not present

## 2016-10-10 DIAGNOSIS — R1310 Dysphagia, unspecified: Secondary | ICD-10-CM | POA: Diagnosis not present

## 2016-10-10 DIAGNOSIS — G309 Alzheimer's disease, unspecified: Secondary | ICD-10-CM | POA: Diagnosis not present

## 2016-10-10 DIAGNOSIS — F0281 Dementia in other diseases classified elsewhere with behavioral disturbance: Secondary | ICD-10-CM | POA: Diagnosis not present

## 2016-10-10 DIAGNOSIS — K219 Gastro-esophageal reflux disease without esophagitis: Secondary | ICD-10-CM | POA: Diagnosis not present

## 2016-10-11 DIAGNOSIS — I4891 Unspecified atrial fibrillation: Secondary | ICD-10-CM | POA: Diagnosis not present

## 2016-10-11 DIAGNOSIS — R1310 Dysphagia, unspecified: Secondary | ICD-10-CM | POA: Diagnosis not present

## 2016-10-11 DIAGNOSIS — K219 Gastro-esophageal reflux disease without esophagitis: Secondary | ICD-10-CM | POA: Diagnosis not present

## 2016-10-11 DIAGNOSIS — I10 Essential (primary) hypertension: Secondary | ICD-10-CM | POA: Diagnosis not present

## 2016-10-11 DIAGNOSIS — G309 Alzheimer's disease, unspecified: Secondary | ICD-10-CM | POA: Diagnosis not present

## 2016-10-11 DIAGNOSIS — F0281 Dementia in other diseases classified elsewhere with behavioral disturbance: Secondary | ICD-10-CM | POA: Diagnosis not present

## 2016-10-12 DIAGNOSIS — I10 Essential (primary) hypertension: Secondary | ICD-10-CM | POA: Diagnosis not present

## 2016-10-12 DIAGNOSIS — K219 Gastro-esophageal reflux disease without esophagitis: Secondary | ICD-10-CM | POA: Diagnosis not present

## 2016-10-12 DIAGNOSIS — G309 Alzheimer's disease, unspecified: Secondary | ICD-10-CM | POA: Diagnosis not present

## 2016-10-12 DIAGNOSIS — F0281 Dementia in other diseases classified elsewhere with behavioral disturbance: Secondary | ICD-10-CM | POA: Diagnosis not present

## 2016-10-12 DIAGNOSIS — I4891 Unspecified atrial fibrillation: Secondary | ICD-10-CM | POA: Diagnosis not present

## 2016-10-12 DIAGNOSIS — R1310 Dysphagia, unspecified: Secondary | ICD-10-CM | POA: Diagnosis not present

## 2016-10-17 DIAGNOSIS — G309 Alzheimer's disease, unspecified: Secondary | ICD-10-CM | POA: Diagnosis not present

## 2016-10-17 DIAGNOSIS — R1310 Dysphagia, unspecified: Secondary | ICD-10-CM | POA: Diagnosis not present

## 2016-10-17 DIAGNOSIS — F0281 Dementia in other diseases classified elsewhere with behavioral disturbance: Secondary | ICD-10-CM | POA: Diagnosis not present

## 2016-10-17 DIAGNOSIS — K219 Gastro-esophageal reflux disease without esophagitis: Secondary | ICD-10-CM | POA: Diagnosis not present

## 2016-10-17 DIAGNOSIS — I4891 Unspecified atrial fibrillation: Secondary | ICD-10-CM | POA: Diagnosis not present

## 2016-10-17 DIAGNOSIS — I10 Essential (primary) hypertension: Secondary | ICD-10-CM | POA: Diagnosis not present

## 2016-10-18 DIAGNOSIS — R630 Anorexia: Secondary | ICD-10-CM | POA: Diagnosis not present

## 2016-10-18 DIAGNOSIS — I10 Essential (primary) hypertension: Secondary | ICD-10-CM | POA: Diagnosis not present

## 2016-10-18 DIAGNOSIS — R1310 Dysphagia, unspecified: Secondary | ICD-10-CM | POA: Diagnosis not present

## 2016-10-18 DIAGNOSIS — H409 Unspecified glaucoma: Secondary | ICD-10-CM | POA: Diagnosis not present

## 2016-10-18 DIAGNOSIS — K219 Gastro-esophageal reflux disease without esophagitis: Secondary | ICD-10-CM | POA: Diagnosis not present

## 2016-10-18 DIAGNOSIS — E039 Hypothyroidism, unspecified: Secondary | ICD-10-CM | POA: Diagnosis not present

## 2016-10-18 DIAGNOSIS — R634 Abnormal weight loss: Secondary | ICD-10-CM | POA: Diagnosis not present

## 2016-10-18 DIAGNOSIS — F0281 Dementia in other diseases classified elsewhere with behavioral disturbance: Secondary | ICD-10-CM | POA: Diagnosis not present

## 2016-10-18 DIAGNOSIS — I4891 Unspecified atrial fibrillation: Secondary | ICD-10-CM | POA: Diagnosis not present

## 2016-10-18 DIAGNOSIS — G309 Alzheimer's disease, unspecified: Secondary | ICD-10-CM | POA: Diagnosis not present

## 2016-10-19 DIAGNOSIS — I10 Essential (primary) hypertension: Secondary | ICD-10-CM | POA: Diagnosis not present

## 2016-10-19 DIAGNOSIS — G309 Alzheimer's disease, unspecified: Secondary | ICD-10-CM | POA: Diagnosis not present

## 2016-10-19 DIAGNOSIS — R1310 Dysphagia, unspecified: Secondary | ICD-10-CM | POA: Diagnosis not present

## 2016-10-19 DIAGNOSIS — F0281 Dementia in other diseases classified elsewhere with behavioral disturbance: Secondary | ICD-10-CM | POA: Diagnosis not present

## 2016-10-19 DIAGNOSIS — K219 Gastro-esophageal reflux disease without esophagitis: Secondary | ICD-10-CM | POA: Diagnosis not present

## 2016-10-19 DIAGNOSIS — I4891 Unspecified atrial fibrillation: Secondary | ICD-10-CM | POA: Diagnosis not present

## 2016-10-20 DIAGNOSIS — I10 Essential (primary) hypertension: Secondary | ICD-10-CM | POA: Diagnosis not present

## 2016-10-20 DIAGNOSIS — F0281 Dementia in other diseases classified elsewhere with behavioral disturbance: Secondary | ICD-10-CM | POA: Diagnosis not present

## 2016-10-20 DIAGNOSIS — K219 Gastro-esophageal reflux disease without esophagitis: Secondary | ICD-10-CM | POA: Diagnosis not present

## 2016-10-20 DIAGNOSIS — G309 Alzheimer's disease, unspecified: Secondary | ICD-10-CM | POA: Diagnosis not present

## 2016-10-20 DIAGNOSIS — I4891 Unspecified atrial fibrillation: Secondary | ICD-10-CM | POA: Diagnosis not present

## 2016-10-20 DIAGNOSIS — R1310 Dysphagia, unspecified: Secondary | ICD-10-CM | POA: Diagnosis not present

## 2016-10-23 DIAGNOSIS — F0281 Dementia in other diseases classified elsewhere with behavioral disturbance: Secondary | ICD-10-CM | POA: Diagnosis not present

## 2016-10-23 DIAGNOSIS — I4891 Unspecified atrial fibrillation: Secondary | ICD-10-CM | POA: Diagnosis not present

## 2016-10-23 DIAGNOSIS — I10 Essential (primary) hypertension: Secondary | ICD-10-CM | POA: Diagnosis not present

## 2016-10-23 DIAGNOSIS — R1310 Dysphagia, unspecified: Secondary | ICD-10-CM | POA: Diagnosis not present

## 2016-10-23 DIAGNOSIS — K219 Gastro-esophageal reflux disease without esophagitis: Secondary | ICD-10-CM | POA: Diagnosis not present

## 2016-10-23 DIAGNOSIS — G309 Alzheimer's disease, unspecified: Secondary | ICD-10-CM | POA: Diagnosis not present

## 2016-10-24 DIAGNOSIS — K219 Gastro-esophageal reflux disease without esophagitis: Secondary | ICD-10-CM | POA: Diagnosis not present

## 2016-10-24 DIAGNOSIS — I10 Essential (primary) hypertension: Secondary | ICD-10-CM | POA: Diagnosis not present

## 2016-10-24 DIAGNOSIS — F0281 Dementia in other diseases classified elsewhere with behavioral disturbance: Secondary | ICD-10-CM | POA: Diagnosis not present

## 2016-10-24 DIAGNOSIS — G309 Alzheimer's disease, unspecified: Secondary | ICD-10-CM | POA: Diagnosis not present

## 2016-10-24 DIAGNOSIS — R1310 Dysphagia, unspecified: Secondary | ICD-10-CM | POA: Diagnosis not present

## 2016-10-24 DIAGNOSIS — I4891 Unspecified atrial fibrillation: Secondary | ICD-10-CM | POA: Diagnosis not present

## 2016-10-26 DIAGNOSIS — R1310 Dysphagia, unspecified: Secondary | ICD-10-CM | POA: Diagnosis not present

## 2016-10-26 DIAGNOSIS — G309 Alzheimer's disease, unspecified: Secondary | ICD-10-CM | POA: Diagnosis not present

## 2016-10-26 DIAGNOSIS — I10 Essential (primary) hypertension: Secondary | ICD-10-CM | POA: Diagnosis not present

## 2016-10-26 DIAGNOSIS — F0281 Dementia in other diseases classified elsewhere with behavioral disturbance: Secondary | ICD-10-CM | POA: Diagnosis not present

## 2016-10-26 DIAGNOSIS — I4891 Unspecified atrial fibrillation: Secondary | ICD-10-CM | POA: Diagnosis not present

## 2016-10-26 DIAGNOSIS — K219 Gastro-esophageal reflux disease without esophagitis: Secondary | ICD-10-CM | POA: Diagnosis not present

## 2016-10-31 DIAGNOSIS — I4891 Unspecified atrial fibrillation: Secondary | ICD-10-CM | POA: Diagnosis not present

## 2016-10-31 DIAGNOSIS — R1310 Dysphagia, unspecified: Secondary | ICD-10-CM | POA: Diagnosis not present

## 2016-10-31 DIAGNOSIS — F0281 Dementia in other diseases classified elsewhere with behavioral disturbance: Secondary | ICD-10-CM | POA: Diagnosis not present

## 2016-10-31 DIAGNOSIS — I10 Essential (primary) hypertension: Secondary | ICD-10-CM | POA: Diagnosis not present

## 2016-10-31 DIAGNOSIS — G309 Alzheimer's disease, unspecified: Secondary | ICD-10-CM | POA: Diagnosis not present

## 2016-10-31 DIAGNOSIS — K219 Gastro-esophageal reflux disease without esophagitis: Secondary | ICD-10-CM | POA: Diagnosis not present

## 2016-11-02 DIAGNOSIS — F0281 Dementia in other diseases classified elsewhere with behavioral disturbance: Secondary | ICD-10-CM | POA: Diagnosis not present

## 2016-11-02 DIAGNOSIS — R1310 Dysphagia, unspecified: Secondary | ICD-10-CM | POA: Diagnosis not present

## 2016-11-02 DIAGNOSIS — I4891 Unspecified atrial fibrillation: Secondary | ICD-10-CM | POA: Diagnosis not present

## 2016-11-02 DIAGNOSIS — I10 Essential (primary) hypertension: Secondary | ICD-10-CM | POA: Diagnosis not present

## 2016-11-02 DIAGNOSIS — K219 Gastro-esophageal reflux disease without esophagitis: Secondary | ICD-10-CM | POA: Diagnosis not present

## 2016-11-02 DIAGNOSIS — G309 Alzheimer's disease, unspecified: Secondary | ICD-10-CM | POA: Diagnosis not present

## 2016-11-03 DIAGNOSIS — K219 Gastro-esophageal reflux disease without esophagitis: Secondary | ICD-10-CM | POA: Diagnosis not present

## 2016-11-03 DIAGNOSIS — G309 Alzheimer's disease, unspecified: Secondary | ICD-10-CM | POA: Diagnosis not present

## 2016-11-03 DIAGNOSIS — I4891 Unspecified atrial fibrillation: Secondary | ICD-10-CM | POA: Diagnosis not present

## 2016-11-03 DIAGNOSIS — I10 Essential (primary) hypertension: Secondary | ICD-10-CM | POA: Diagnosis not present

## 2016-11-03 DIAGNOSIS — R1310 Dysphagia, unspecified: Secondary | ICD-10-CM | POA: Diagnosis not present

## 2016-11-03 DIAGNOSIS — F0281 Dementia in other diseases classified elsewhere with behavioral disturbance: Secondary | ICD-10-CM | POA: Diagnosis not present

## 2016-11-05 DIAGNOSIS — F0281 Dementia in other diseases classified elsewhere with behavioral disturbance: Secondary | ICD-10-CM | POA: Diagnosis not present

## 2016-11-05 DIAGNOSIS — R1310 Dysphagia, unspecified: Secondary | ICD-10-CM | POA: Diagnosis not present

## 2016-11-05 DIAGNOSIS — K219 Gastro-esophageal reflux disease without esophagitis: Secondary | ICD-10-CM | POA: Diagnosis not present

## 2016-11-05 DIAGNOSIS — I10 Essential (primary) hypertension: Secondary | ICD-10-CM | POA: Diagnosis not present

## 2016-11-05 DIAGNOSIS — G309 Alzheimer's disease, unspecified: Secondary | ICD-10-CM | POA: Diagnosis not present

## 2016-11-05 DIAGNOSIS — I4891 Unspecified atrial fibrillation: Secondary | ICD-10-CM | POA: Diagnosis not present

## 2016-11-07 DIAGNOSIS — I4891 Unspecified atrial fibrillation: Secondary | ICD-10-CM | POA: Diagnosis not present

## 2016-11-07 DIAGNOSIS — I10 Essential (primary) hypertension: Secondary | ICD-10-CM | POA: Diagnosis not present

## 2016-11-07 DIAGNOSIS — G309 Alzheimer's disease, unspecified: Secondary | ICD-10-CM | POA: Diagnosis not present

## 2016-11-07 DIAGNOSIS — R1310 Dysphagia, unspecified: Secondary | ICD-10-CM | POA: Diagnosis not present

## 2016-11-07 DIAGNOSIS — F0281 Dementia in other diseases classified elsewhere with behavioral disturbance: Secondary | ICD-10-CM | POA: Diagnosis not present

## 2016-11-07 DIAGNOSIS — K219 Gastro-esophageal reflux disease without esophagitis: Secondary | ICD-10-CM | POA: Diagnosis not present

## 2016-11-08 DIAGNOSIS — F0281 Dementia in other diseases classified elsewhere with behavioral disturbance: Secondary | ICD-10-CM | POA: Diagnosis not present

## 2016-11-08 DIAGNOSIS — Q845 Enlarged and hypertrophic nails: Secondary | ICD-10-CM | POA: Diagnosis not present

## 2016-11-08 DIAGNOSIS — I4891 Unspecified atrial fibrillation: Secondary | ICD-10-CM | POA: Diagnosis not present

## 2016-11-08 DIAGNOSIS — G309 Alzheimer's disease, unspecified: Secondary | ICD-10-CM | POA: Diagnosis not present

## 2016-11-08 DIAGNOSIS — K219 Gastro-esophageal reflux disease without esophagitis: Secondary | ICD-10-CM | POA: Diagnosis not present

## 2016-11-08 DIAGNOSIS — M2010 Hallux valgus (acquired), unspecified foot: Secondary | ICD-10-CM | POA: Diagnosis not present

## 2016-11-08 DIAGNOSIS — L853 Xerosis cutis: Secondary | ICD-10-CM | POA: Diagnosis not present

## 2016-11-08 DIAGNOSIS — R1310 Dysphagia, unspecified: Secondary | ICD-10-CM | POA: Diagnosis not present

## 2016-11-08 DIAGNOSIS — L603 Nail dystrophy: Secondary | ICD-10-CM | POA: Diagnosis not present

## 2016-11-08 DIAGNOSIS — I10 Essential (primary) hypertension: Secondary | ICD-10-CM | POA: Diagnosis not present

## 2016-11-08 DIAGNOSIS — B351 Tinea unguium: Secondary | ICD-10-CM | POA: Diagnosis not present

## 2016-11-09 DIAGNOSIS — K219 Gastro-esophageal reflux disease without esophagitis: Secondary | ICD-10-CM | POA: Diagnosis not present

## 2016-11-09 DIAGNOSIS — F0281 Dementia in other diseases classified elsewhere with behavioral disturbance: Secondary | ICD-10-CM | POA: Diagnosis not present

## 2016-11-09 DIAGNOSIS — I10 Essential (primary) hypertension: Secondary | ICD-10-CM | POA: Diagnosis not present

## 2016-11-09 DIAGNOSIS — G309 Alzheimer's disease, unspecified: Secondary | ICD-10-CM | POA: Diagnosis not present

## 2016-11-09 DIAGNOSIS — I4891 Unspecified atrial fibrillation: Secondary | ICD-10-CM | POA: Diagnosis not present

## 2016-11-09 DIAGNOSIS — R1310 Dysphagia, unspecified: Secondary | ICD-10-CM | POA: Diagnosis not present

## 2016-11-10 DIAGNOSIS — I4891 Unspecified atrial fibrillation: Secondary | ICD-10-CM | POA: Diagnosis not present

## 2016-11-10 DIAGNOSIS — G309 Alzheimer's disease, unspecified: Secondary | ICD-10-CM | POA: Diagnosis not present

## 2016-11-10 DIAGNOSIS — K219 Gastro-esophageal reflux disease without esophagitis: Secondary | ICD-10-CM | POA: Diagnosis not present

## 2016-11-10 DIAGNOSIS — F0281 Dementia in other diseases classified elsewhere with behavioral disturbance: Secondary | ICD-10-CM | POA: Diagnosis not present

## 2016-11-10 DIAGNOSIS — R1310 Dysphagia, unspecified: Secondary | ICD-10-CM | POA: Diagnosis not present

## 2016-11-10 DIAGNOSIS — I10 Essential (primary) hypertension: Secondary | ICD-10-CM | POA: Diagnosis not present

## 2016-11-13 DIAGNOSIS — G309 Alzheimer's disease, unspecified: Secondary | ICD-10-CM | POA: Diagnosis not present

## 2016-11-13 DIAGNOSIS — I4891 Unspecified atrial fibrillation: Secondary | ICD-10-CM | POA: Diagnosis not present

## 2016-11-13 DIAGNOSIS — K219 Gastro-esophageal reflux disease without esophagitis: Secondary | ICD-10-CM | POA: Diagnosis not present

## 2016-11-13 DIAGNOSIS — R1310 Dysphagia, unspecified: Secondary | ICD-10-CM | POA: Diagnosis not present

## 2016-11-13 DIAGNOSIS — I10 Essential (primary) hypertension: Secondary | ICD-10-CM | POA: Diagnosis not present

## 2016-11-13 DIAGNOSIS — F0281 Dementia in other diseases classified elsewhere with behavioral disturbance: Secondary | ICD-10-CM | POA: Diagnosis not present

## 2016-11-14 DIAGNOSIS — G309 Alzheimer's disease, unspecified: Secondary | ICD-10-CM | POA: Diagnosis not present

## 2016-11-14 DIAGNOSIS — K219 Gastro-esophageal reflux disease without esophagitis: Secondary | ICD-10-CM | POA: Diagnosis not present

## 2016-11-14 DIAGNOSIS — I4891 Unspecified atrial fibrillation: Secondary | ICD-10-CM | POA: Diagnosis not present

## 2016-11-14 DIAGNOSIS — F0281 Dementia in other diseases classified elsewhere with behavioral disturbance: Secondary | ICD-10-CM | POA: Diagnosis not present

## 2016-11-14 DIAGNOSIS — I10 Essential (primary) hypertension: Secondary | ICD-10-CM | POA: Diagnosis not present

## 2016-11-14 DIAGNOSIS — R1310 Dysphagia, unspecified: Secondary | ICD-10-CM | POA: Diagnosis not present

## 2016-11-16 DIAGNOSIS — R1310 Dysphagia, unspecified: Secondary | ICD-10-CM | POA: Diagnosis not present

## 2016-11-16 DIAGNOSIS — I10 Essential (primary) hypertension: Secondary | ICD-10-CM | POA: Diagnosis not present

## 2016-11-16 DIAGNOSIS — I4891 Unspecified atrial fibrillation: Secondary | ICD-10-CM | POA: Diagnosis not present

## 2016-11-16 DIAGNOSIS — F0281 Dementia in other diseases classified elsewhere with behavioral disturbance: Secondary | ICD-10-CM | POA: Diagnosis not present

## 2016-11-16 DIAGNOSIS — K219 Gastro-esophageal reflux disease without esophagitis: Secondary | ICD-10-CM | POA: Diagnosis not present

## 2016-11-16 DIAGNOSIS — G309 Alzheimer's disease, unspecified: Secondary | ICD-10-CM | POA: Diagnosis not present

## 2016-11-17 DIAGNOSIS — G309 Alzheimer's disease, unspecified: Secondary | ICD-10-CM | POA: Diagnosis not present

## 2016-11-17 DIAGNOSIS — F0281 Dementia in other diseases classified elsewhere with behavioral disturbance: Secondary | ICD-10-CM | POA: Diagnosis not present

## 2016-11-17 DIAGNOSIS — I10 Essential (primary) hypertension: Secondary | ICD-10-CM | POA: Diagnosis not present

## 2016-11-17 DIAGNOSIS — K219 Gastro-esophageal reflux disease without esophagitis: Secondary | ICD-10-CM | POA: Diagnosis not present

## 2016-11-17 DIAGNOSIS — I4891 Unspecified atrial fibrillation: Secondary | ICD-10-CM | POA: Diagnosis not present

## 2016-11-17 DIAGNOSIS — R1310 Dysphagia, unspecified: Secondary | ICD-10-CM | POA: Diagnosis not present

## 2016-11-18 DIAGNOSIS — I10 Essential (primary) hypertension: Secondary | ICD-10-CM | POA: Diagnosis not present

## 2016-11-18 DIAGNOSIS — K219 Gastro-esophageal reflux disease without esophagitis: Secondary | ICD-10-CM | POA: Diagnosis not present

## 2016-11-18 DIAGNOSIS — R1310 Dysphagia, unspecified: Secondary | ICD-10-CM | POA: Diagnosis not present

## 2016-11-18 DIAGNOSIS — G309 Alzheimer's disease, unspecified: Secondary | ICD-10-CM | POA: Diagnosis not present

## 2016-11-18 DIAGNOSIS — I4891 Unspecified atrial fibrillation: Secondary | ICD-10-CM | POA: Diagnosis not present

## 2016-11-18 DIAGNOSIS — H409 Unspecified glaucoma: Secondary | ICD-10-CM | POA: Diagnosis not present

## 2016-11-18 DIAGNOSIS — F0281 Dementia in other diseases classified elsewhere with behavioral disturbance: Secondary | ICD-10-CM | POA: Diagnosis not present

## 2016-11-18 DIAGNOSIS — E039 Hypothyroidism, unspecified: Secondary | ICD-10-CM | POA: Diagnosis not present

## 2016-11-21 DIAGNOSIS — G309 Alzheimer's disease, unspecified: Secondary | ICD-10-CM | POA: Diagnosis not present

## 2016-11-21 DIAGNOSIS — I4891 Unspecified atrial fibrillation: Secondary | ICD-10-CM | POA: Diagnosis not present

## 2016-11-21 DIAGNOSIS — F0281 Dementia in other diseases classified elsewhere with behavioral disturbance: Secondary | ICD-10-CM | POA: Diagnosis not present

## 2016-11-21 DIAGNOSIS — R1310 Dysphagia, unspecified: Secondary | ICD-10-CM | POA: Diagnosis not present

## 2016-11-21 DIAGNOSIS — K219 Gastro-esophageal reflux disease without esophagitis: Secondary | ICD-10-CM | POA: Diagnosis not present

## 2016-11-21 DIAGNOSIS — I10 Essential (primary) hypertension: Secondary | ICD-10-CM | POA: Diagnosis not present

## 2016-11-23 DIAGNOSIS — K219 Gastro-esophageal reflux disease without esophagitis: Secondary | ICD-10-CM | POA: Diagnosis not present

## 2016-11-23 DIAGNOSIS — G309 Alzheimer's disease, unspecified: Secondary | ICD-10-CM | POA: Diagnosis not present

## 2016-11-23 DIAGNOSIS — I4891 Unspecified atrial fibrillation: Secondary | ICD-10-CM | POA: Diagnosis not present

## 2016-11-23 DIAGNOSIS — I10 Essential (primary) hypertension: Secondary | ICD-10-CM | POA: Diagnosis not present

## 2016-11-23 DIAGNOSIS — R1310 Dysphagia, unspecified: Secondary | ICD-10-CM | POA: Diagnosis not present

## 2016-11-23 DIAGNOSIS — F0281 Dementia in other diseases classified elsewhere with behavioral disturbance: Secondary | ICD-10-CM | POA: Diagnosis not present

## 2016-11-24 DIAGNOSIS — G309 Alzheimer's disease, unspecified: Secondary | ICD-10-CM | POA: Diagnosis not present

## 2016-11-24 DIAGNOSIS — K219 Gastro-esophageal reflux disease without esophagitis: Secondary | ICD-10-CM | POA: Diagnosis not present

## 2016-11-24 DIAGNOSIS — I4891 Unspecified atrial fibrillation: Secondary | ICD-10-CM | POA: Diagnosis not present

## 2016-11-24 DIAGNOSIS — R1310 Dysphagia, unspecified: Secondary | ICD-10-CM | POA: Diagnosis not present

## 2016-11-24 DIAGNOSIS — I10 Essential (primary) hypertension: Secondary | ICD-10-CM | POA: Diagnosis not present

## 2016-11-24 DIAGNOSIS — F0281 Dementia in other diseases classified elsewhere with behavioral disturbance: Secondary | ICD-10-CM | POA: Diagnosis not present

## 2016-11-27 DIAGNOSIS — H43813 Vitreous degeneration, bilateral: Secondary | ICD-10-CM | POA: Diagnosis not present

## 2016-11-27 DIAGNOSIS — H18413 Arcus senilis, bilateral: Secondary | ICD-10-CM | POA: Diagnosis not present

## 2016-11-27 DIAGNOSIS — Z961 Presence of intraocular lens: Secondary | ICD-10-CM | POA: Diagnosis not present

## 2016-11-27 DIAGNOSIS — H353131 Nonexudative age-related macular degeneration, bilateral, early dry stage: Secondary | ICD-10-CM | POA: Diagnosis not present

## 2016-11-27 DIAGNOSIS — H35033 Hypertensive retinopathy, bilateral: Secondary | ICD-10-CM | POA: Diagnosis not present

## 2016-11-27 DIAGNOSIS — H401132 Primary open-angle glaucoma, bilateral, moderate stage: Secondary | ICD-10-CM | POA: Diagnosis not present

## 2016-11-28 DIAGNOSIS — R1310 Dysphagia, unspecified: Secondary | ICD-10-CM | POA: Diagnosis not present

## 2016-11-28 DIAGNOSIS — I4891 Unspecified atrial fibrillation: Secondary | ICD-10-CM | POA: Diagnosis not present

## 2016-11-28 DIAGNOSIS — G309 Alzheimer's disease, unspecified: Secondary | ICD-10-CM | POA: Diagnosis not present

## 2016-11-28 DIAGNOSIS — K219 Gastro-esophageal reflux disease without esophagitis: Secondary | ICD-10-CM | POA: Diagnosis not present

## 2016-11-28 DIAGNOSIS — F0281 Dementia in other diseases classified elsewhere with behavioral disturbance: Secondary | ICD-10-CM | POA: Diagnosis not present

## 2016-11-28 DIAGNOSIS — I10 Essential (primary) hypertension: Secondary | ICD-10-CM | POA: Diagnosis not present

## 2016-11-29 DIAGNOSIS — R1310 Dysphagia, unspecified: Secondary | ICD-10-CM | POA: Diagnosis not present

## 2016-11-29 DIAGNOSIS — I4891 Unspecified atrial fibrillation: Secondary | ICD-10-CM | POA: Diagnosis not present

## 2016-11-29 DIAGNOSIS — F0281 Dementia in other diseases classified elsewhere with behavioral disturbance: Secondary | ICD-10-CM | POA: Diagnosis not present

## 2016-11-29 DIAGNOSIS — I10 Essential (primary) hypertension: Secondary | ICD-10-CM | POA: Diagnosis not present

## 2016-11-29 DIAGNOSIS — K219 Gastro-esophageal reflux disease without esophagitis: Secondary | ICD-10-CM | POA: Diagnosis not present

## 2016-11-29 DIAGNOSIS — G309 Alzheimer's disease, unspecified: Secondary | ICD-10-CM | POA: Diagnosis not present

## 2016-11-30 DIAGNOSIS — G309 Alzheimer's disease, unspecified: Secondary | ICD-10-CM | POA: Diagnosis not present

## 2016-11-30 DIAGNOSIS — I4891 Unspecified atrial fibrillation: Secondary | ICD-10-CM | POA: Diagnosis not present

## 2016-11-30 DIAGNOSIS — R1310 Dysphagia, unspecified: Secondary | ICD-10-CM | POA: Diagnosis not present

## 2016-11-30 DIAGNOSIS — K219 Gastro-esophageal reflux disease without esophagitis: Secondary | ICD-10-CM | POA: Diagnosis not present

## 2016-11-30 DIAGNOSIS — I10 Essential (primary) hypertension: Secondary | ICD-10-CM | POA: Diagnosis not present

## 2016-11-30 DIAGNOSIS — F0281 Dementia in other diseases classified elsewhere with behavioral disturbance: Secondary | ICD-10-CM | POA: Diagnosis not present

## 2016-12-05 DIAGNOSIS — F0281 Dementia in other diseases classified elsewhere with behavioral disturbance: Secondary | ICD-10-CM | POA: Diagnosis not present

## 2016-12-05 DIAGNOSIS — K219 Gastro-esophageal reflux disease without esophagitis: Secondary | ICD-10-CM | POA: Diagnosis not present

## 2016-12-05 DIAGNOSIS — G309 Alzheimer's disease, unspecified: Secondary | ICD-10-CM | POA: Diagnosis not present

## 2016-12-05 DIAGNOSIS — R1310 Dysphagia, unspecified: Secondary | ICD-10-CM | POA: Diagnosis not present

## 2016-12-05 DIAGNOSIS — I10 Essential (primary) hypertension: Secondary | ICD-10-CM | POA: Diagnosis not present

## 2016-12-05 DIAGNOSIS — I4891 Unspecified atrial fibrillation: Secondary | ICD-10-CM | POA: Diagnosis not present

## 2016-12-07 DIAGNOSIS — K219 Gastro-esophageal reflux disease without esophagitis: Secondary | ICD-10-CM | POA: Diagnosis not present

## 2016-12-07 DIAGNOSIS — G309 Alzheimer's disease, unspecified: Secondary | ICD-10-CM | POA: Diagnosis not present

## 2016-12-07 DIAGNOSIS — I10 Essential (primary) hypertension: Secondary | ICD-10-CM | POA: Diagnosis not present

## 2016-12-07 DIAGNOSIS — I4891 Unspecified atrial fibrillation: Secondary | ICD-10-CM | POA: Diagnosis not present

## 2016-12-07 DIAGNOSIS — F0281 Dementia in other diseases classified elsewhere with behavioral disturbance: Secondary | ICD-10-CM | POA: Diagnosis not present

## 2016-12-07 DIAGNOSIS — R1310 Dysphagia, unspecified: Secondary | ICD-10-CM | POA: Diagnosis not present

## 2016-12-07 DIAGNOSIS — Z23 Encounter for immunization: Secondary | ICD-10-CM | POA: Diagnosis not present

## 2016-12-10 DIAGNOSIS — G309 Alzheimer's disease, unspecified: Secondary | ICD-10-CM | POA: Diagnosis not present

## 2016-12-10 DIAGNOSIS — F0281 Dementia in other diseases classified elsewhere with behavioral disturbance: Secondary | ICD-10-CM | POA: Diagnosis not present

## 2016-12-10 DIAGNOSIS — K219 Gastro-esophageal reflux disease without esophagitis: Secondary | ICD-10-CM | POA: Diagnosis not present

## 2016-12-10 DIAGNOSIS — I4891 Unspecified atrial fibrillation: Secondary | ICD-10-CM | POA: Diagnosis not present

## 2016-12-10 DIAGNOSIS — I10 Essential (primary) hypertension: Secondary | ICD-10-CM | POA: Diagnosis not present

## 2016-12-10 DIAGNOSIS — R1310 Dysphagia, unspecified: Secondary | ICD-10-CM | POA: Diagnosis not present

## 2016-12-12 DIAGNOSIS — K219 Gastro-esophageal reflux disease without esophagitis: Secondary | ICD-10-CM | POA: Diagnosis not present

## 2016-12-12 DIAGNOSIS — I10 Essential (primary) hypertension: Secondary | ICD-10-CM | POA: Diagnosis not present

## 2016-12-12 DIAGNOSIS — I4891 Unspecified atrial fibrillation: Secondary | ICD-10-CM | POA: Diagnosis not present

## 2016-12-12 DIAGNOSIS — F0281 Dementia in other diseases classified elsewhere with behavioral disturbance: Secondary | ICD-10-CM | POA: Diagnosis not present

## 2016-12-12 DIAGNOSIS — R1310 Dysphagia, unspecified: Secondary | ICD-10-CM | POA: Diagnosis not present

## 2016-12-12 DIAGNOSIS — G309 Alzheimer's disease, unspecified: Secondary | ICD-10-CM | POA: Diagnosis not present

## 2016-12-14 DIAGNOSIS — I10 Essential (primary) hypertension: Secondary | ICD-10-CM | POA: Diagnosis not present

## 2016-12-14 DIAGNOSIS — K219 Gastro-esophageal reflux disease without esophagitis: Secondary | ICD-10-CM | POA: Diagnosis not present

## 2016-12-14 DIAGNOSIS — I4891 Unspecified atrial fibrillation: Secondary | ICD-10-CM | POA: Diagnosis not present

## 2016-12-14 DIAGNOSIS — R1310 Dysphagia, unspecified: Secondary | ICD-10-CM | POA: Diagnosis not present

## 2016-12-14 DIAGNOSIS — G309 Alzheimer's disease, unspecified: Secondary | ICD-10-CM | POA: Diagnosis not present

## 2016-12-14 DIAGNOSIS — F0281 Dementia in other diseases classified elsewhere with behavioral disturbance: Secondary | ICD-10-CM | POA: Diagnosis not present

## 2016-12-18 DIAGNOSIS — K219 Gastro-esophageal reflux disease without esophagitis: Secondary | ICD-10-CM | POA: Diagnosis not present

## 2016-12-18 DIAGNOSIS — R1310 Dysphagia, unspecified: Secondary | ICD-10-CM | POA: Diagnosis not present

## 2016-12-18 DIAGNOSIS — I10 Essential (primary) hypertension: Secondary | ICD-10-CM | POA: Diagnosis not present

## 2016-12-18 DIAGNOSIS — E039 Hypothyroidism, unspecified: Secondary | ICD-10-CM | POA: Diagnosis not present

## 2016-12-18 DIAGNOSIS — G309 Alzheimer's disease, unspecified: Secondary | ICD-10-CM | POA: Diagnosis not present

## 2016-12-18 DIAGNOSIS — I4891 Unspecified atrial fibrillation: Secondary | ICD-10-CM | POA: Diagnosis not present

## 2016-12-18 DIAGNOSIS — H409 Unspecified glaucoma: Secondary | ICD-10-CM | POA: Diagnosis not present

## 2016-12-18 DIAGNOSIS — F0281 Dementia in other diseases classified elsewhere with behavioral disturbance: Secondary | ICD-10-CM | POA: Diagnosis not present

## 2016-12-26 DIAGNOSIS — N390 Urinary tract infection, site not specified: Secondary | ICD-10-CM | POA: Diagnosis not present

## 2017-01-10 ENCOUNTER — Emergency Department (HOSPITAL_COMMUNITY)

## 2017-01-10 ENCOUNTER — Emergency Department (HOSPITAL_COMMUNITY)
Admission: EM | Admit: 2017-01-10 | Discharge: 2017-01-10 | Disposition: A | Discharge status: Home / Self Care | Attending: Emergency Medicine | Admitting: Emergency Medicine

## 2017-01-10 ENCOUNTER — Encounter (HOSPITAL_COMMUNITY): Payer: Self-pay | Admitting: Emergency Medicine

## 2017-01-10 DIAGNOSIS — S098XXA Other specified injuries of head, initial encounter: Secondary | ICD-10-CM | POA: Diagnosis not present

## 2017-01-10 DIAGNOSIS — Y999 Unspecified external cause status: Secondary | ICD-10-CM | POA: Insufficient documentation

## 2017-01-10 DIAGNOSIS — Y9301 Activity, walking, marching and hiking: Secondary | ICD-10-CM | POA: Insufficient documentation

## 2017-01-10 DIAGNOSIS — W19XXXA Unspecified fall, initial encounter: Secondary | ICD-10-CM | POA: Diagnosis not present

## 2017-01-10 DIAGNOSIS — S0990XA Unspecified injury of head, initial encounter: Secondary | ICD-10-CM | POA: Diagnosis present

## 2017-01-10 DIAGNOSIS — F0390 Unspecified dementia without behavioral disturbance: Secondary | ICD-10-CM | POA: Insufficient documentation

## 2017-01-10 DIAGNOSIS — S0003XA Contusion of scalp, initial encounter: Secondary | ICD-10-CM | POA: Insufficient documentation

## 2017-01-10 DIAGNOSIS — Z79899 Other long term (current) drug therapy: Secondary | ICD-10-CM | POA: Insufficient documentation

## 2017-01-10 DIAGNOSIS — G4489 Other headache syndrome: Secondary | ICD-10-CM | POA: Diagnosis not present

## 2017-01-10 DIAGNOSIS — M25559 Pain in unspecified hip: Secondary | ICD-10-CM

## 2017-01-10 DIAGNOSIS — E039 Hypothyroidism, unspecified: Secondary | ICD-10-CM | POA: Diagnosis not present

## 2017-01-10 DIAGNOSIS — Z7901 Long term (current) use of anticoagulants: Secondary | ICD-10-CM | POA: Diagnosis not present

## 2017-01-10 DIAGNOSIS — M25551 Pain in right hip: Secondary | ICD-10-CM | POA: Diagnosis not present

## 2017-01-10 DIAGNOSIS — Y92129 Unspecified place in nursing home as the place of occurrence of the external cause: Secondary | ICD-10-CM | POA: Insufficient documentation

## 2017-01-10 LAB — URINALYSIS, ROUTINE W REFLEX MICROSCOPIC
Bilirubin Urine: NEGATIVE
GLUCOSE, UA: NEGATIVE mg/dL
Hgb urine dipstick: NEGATIVE
Ketones, ur: NEGATIVE mg/dL
Nitrite: NEGATIVE
PROTEIN: NEGATIVE mg/dL
Specific Gravity, Urine: 1.017 (ref 1.005–1.030)
pH: 6 (ref 5.0–8.0)

## 2017-01-10 LAB — CBC WITH DIFFERENTIAL/PLATELET
BASOS ABS: 0 10*3/uL (ref 0.0–0.1)
BASOS PCT: 0 %
EOS ABS: 0.2 10*3/uL (ref 0.0–0.7)
Eosinophils Relative: 2 %
HCT: 39.2 % (ref 36.0–46.0)
Hemoglobin: 13.3 g/dL (ref 12.0–15.0)
Lymphocytes Relative: 35 %
Lymphs Abs: 2.7 10*3/uL (ref 0.7–4.0)
MCH: 31.7 pg (ref 26.0–34.0)
MCHC: 33.9 g/dL (ref 30.0–36.0)
MCV: 93.3 fL (ref 78.0–100.0)
Monocytes Absolute: 0.5 10*3/uL (ref 0.1–1.0)
Monocytes Relative: 6 %
NEUTROS PCT: 57 %
Neutro Abs: 4.5 10*3/uL (ref 1.7–7.7)
Platelets: 291 10*3/uL (ref 150–400)
RBC: 4.2 MIL/uL (ref 3.87–5.11)
RDW: 13 % (ref 11.5–15.5)
WBC: 7.8 10*3/uL (ref 4.0–10.5)

## 2017-01-10 LAB — BASIC METABOLIC PANEL
ANION GAP: 7 (ref 5–15)
BUN: 26 mg/dL — ABNORMAL HIGH (ref 6–20)
CO2: 26 mmol/L (ref 22–32)
Calcium: 8.8 mg/dL — ABNORMAL LOW (ref 8.9–10.3)
Chloride: 105 mmol/L (ref 101–111)
Creatinine, Ser: 0.83 mg/dL (ref 0.44–1.00)
GFR calc Af Amer: >60 mL/min (ref >60)
GFR calc non Af Amer: >60 mL/min (ref >60)
Glucose, Bld: 109 mg/dL — ABNORMAL HIGH (ref 65–99)
POTASSIUM: 4.4 mmol/L (ref 3.5–5.1)
SODIUM: 138 mmol/L (ref 135–145)

## 2017-01-10 MED ORDER — MORPHINE SULFATE (PF) 4 MG/ML IV SOLN
2.0000 mg | Freq: Once | INTRAVENOUS | Status: AC
  Administered 2017-01-10: 2 mg via INTRAVENOUS
  Filled 2017-01-10: qty 1

## 2017-01-10 MED ORDER — ACETAMINOPHEN 500 MG PO TABS
1000.0000 mg | ORAL_TABLET | Freq: Once | ORAL | Status: AC
  Administered 2017-01-10: 1000 mg via ORAL
  Filled 2017-01-10: qty 2

## 2017-01-10 MED ORDER — DIVALPROEX SODIUM 250 MG PO DR TAB: 500.0000 mg | DELAYED_RELEASE_TABLET | Freq: Once | ORAL | Status: DC

## 2017-01-10 NOTE — ED Notes (Signed)
Pt ambulated with 2 person assist, pt was favoring R leg, with further assessment estimated to be pt R hip, no deformity or bruising

## 2017-01-10 NOTE — Discharge Instructions (Signed)
Your CT head, neck, pelvis does not show serious injury. Continue tylenol for pain. We recommend physical therapy for when you return to your nursing facility.  Please return without fail for worsening symptoms, including confusion, intractable vomiting, escalating pain, or any other symptoms concerning to you.

## 2017-01-10 NOTE — ED Provider Notes (Signed)
[facility] EMERGENCY DEPARTMENT Provider Note   CSN: [id number] Arrival date & time: 01/10/17  1611     History   Chief Complaint Chief Complaint  Patient presents with  . Fall    HPI Ruth Bartlett is a 83 y.o. female.  The history is provided by a relative.  Fall  This is a new problem. The current episode started 1 to 2 hours ago. The problem occurs every several days. The problem has been resolved. Nothing aggravates the symptoms. Nothing relieves the symptoms. She has tried nothing for the symptoms.   83 year old female who presents after an unwitnessed fall from nursing facility. She has a history of dementia, paroxysmal atrial fibrillation on apixaban and, and hypothyroidism. Found on floor by nursing facility staff with hematoma over head. At baseline disoriented. Her daughter-in-law is present during evaluation in ED, and reports mentation is baseline. Recently treated for recurrent UTI. No confusion, fevers, or other illnesses. Patient denies pain.  Past Medical History:  Diagnosis Date  . Dementia   . Thyroid disease     There are no active problems to display for this patient.   Past Surgical History:  Procedure Laterality Date  . CHOLECYSTECTOMY      OB History    No data available       Home Medications    Prior to Admission medications   Medication Sig Start Date End Date Taking? Authorizing Provider  apixaban (ELIQUIS) 2.5 MG TABS tablet Take 2.5 mg by mouth 2 (two) times daily.    Yes [provider]  divalproex (DEPAKOTE SPRINKLE) 125 MG capsule Take 250 mg by mouth 2 (two) times daily.   Yes [provider]  latanoprost (XALATAN) 0.005 % ophthalmic solution Place 1 drop into both eyes at bedtime.   Yes [provider]  levothyroxine (SYNTHROID, LEVOTHROID) 50 MCG tablet Take 50 mcg by mouth daily before breakfast.   Yes [provider]  LORazepam (ATIVAN) 0.5 MG tablet Take 0.5 mg by mouth 2  (two) times daily. Give .5mg  by mouth Once daily as needed and .25mg  by mouth at 8pm every night.   Yes [provider]  Melatonin 3 MG TABS Take 3 mg by mouth at bedtime.   Yes [provider]  metoprolol succinate (TOPROL-XL) 25 MG 24 hr tablet Take 25 mg by mouth daily.   Yes [provider]  mirtazapine (REMERON) 15 MG tablet Take 7.5 mg by mouth at bedtime.   Yes [provider]  polyethylene glycol (MIRALAX / GLYCOLAX) packet Take 17 g by mouth daily.   Yes [provider]  sertraline (ZOLOFT) 100 MG tablet Take 100 mg by mouth daily.    [provider]    Family History No family history on file.  Social History Social History  Substance Use Topics  . Smoking status: Never Smoker  . Smokeless tobacco: Never Used  . Alcohol use No     Allergies   Ace inhibitors and Depakote [divalproex sodium]   Review of Systems Review of Systems  Unable to perform ROS: Dementia     Physical Exam Updated Vital Signs BP (!) 144/74   Pulse 79   Temp 97.8 F (36.6 C) (Oral)   Resp 19   Ht 5\' 4"  (1.626 m)   Wt 63.5 kg (140 lb)   SpO2 100%   BMI 24.03 kg/m   Physical Exam Physical Exam  Nursing note and vitals reviewed. Constitutional: elderly woman, well-appearing, non-toxic, and in  no acute distress Head: Normocephalic and hematoma over right parietal scalp.  Mouth/Throat: Oropharynx is clear and moist.  Neck: Normal range of motion. Neck supple. no cervical spina tenderness Cardiovascular: Normal rate and regular rhythm.   Pulmonary/Chest: Effort normal and breath sounds normal. No chest wall tenderness Abdominal: Soft. There is no tenderness. There is no rebound and no guarding.  Musculoskeletal: Normal range of motion of all 4 extremities.  Neurological: Alert, no facial droop, speaks in 1-2 words, seems fluent, oriented x 0,  moves all extremities symmetrically Skin: Skin is warm and dry.  Psychiatric:  Cooperative   ED Treatments / Results  Labs (all labs ordered are listed, but only abnormal results are displayed) Labs Reviewed  BASIC METABOLIC PANEL - Abnormal; Notable for the following:       Result Value   Glucose, Bld 109 (*)    BUN 26 (*)    Calcium 8.8 (*)    All other components within normal limits  URINALYSIS, ROUTINE W REFLEX MICROSCOPIC - Abnormal; Notable for the following:    Leukocytes, UA SMALL (*)    Bacteria, UA RARE (*)    Squamous Epithelial / LPF 0-5 (*)    All other components within normal limits  CBC WITH DIFFERENTIAL/PLATELET    EKG  EKG Interpretation  Date/Time:  Wednesday January 10 2017 16:38:33 EDT Ventricular Rate:  54 PR Interval:    QRS Duration: 98 QT Interval:  495 QTC Calculation: 474 R Axis:   21 Text Interpretation:  Sinus rhythm Nonspecific T abnormalities, lateral leads similar to previous EKG  Confirmed by Crista Curb [phone]) on 01/10/2017 5:04:43 PM       Radiology Ct Head Wo Contrast  Result Date: 01/10/2017 CLINICAL DATA:  Unwitnessed fall with laceration to the right head EXAM: CT HEAD WITHOUT CONTRAST CT CERVICAL SPINE WITHOUT CONTRAST TECHNIQUE: Multidetector CT imaging of the head and cervical spine was performed following the standard protocol without intravenous contrast. Multiplanar CT image reconstructions of the cervical spine were also generated. COMPARISON:  06/17/2014 FINDINGS: CT HEAD FINDINGS Brain: No acute territorial infarction, hemorrhage or intracranial mass is visualized. Marked atrophy. Moderate small vessel ischemic changes of the white matter. 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 Stable ventricle size. Vascular: No hyperdense vessels.  Carotid artery calcification. Skull: No depressed skull fracture.  Prominent vascular channels. Sinuses/Orbits: Mild mucosal thickening in the ethmoid and left sphenoid sinus which is hypoplastic. No acute orbital abnormality. Other: Large right frontal scalp  hematoma. CT CERVICAL SPINE FINDINGS Alignment: Trace anterolisthesis of C4 on C5 as before. Facet alignment is within normal limits. Skull base and vertebrae: Craniovertebral junction is intact. No fracture. Stable sclerosis posterior C5. Soft tissues and spinal canal: No prevertebral fluid or swelling. No visible canal hematoma. Disc levels: Marked degenerative changes C4 through C6 with partial bony fusion present. Mild degenerative changes elsewhere in the spine. Multiple level facet hypertrophic arthropathy with bilateral foraminal stenosis. Upper chest: Lung apices show scarring at the right apex. Probable right lobectomy of the thyroid. Small hypodense subcentimeter in left lobe thyroid nodules. Other: None IMPRESSION: 1. No CT evidence for acute intracranial abnormality. Atrophy and small vessel ischemic changes of the white matter 2. Large right frontal scalp hematoma 3. Degenerative changes of the cervical spine. No acute osseous abnormality. Electronically Signed   By: Jasmine Pang M.D.   On: 01/10/2017 17:16   Ct Cervical Spine Wo Contrast  Result Date: 01/10/2017 CLINICAL DATA:  Unwitnessed fall with laceration to  the right head EXAM: CT HEAD WITHOUT CONTRAST CT CERVICAL SPINE WITHOUT CONTRAST TECHNIQUE: Multidetector CT imaging of the head and cervical spine was performed following the standard protocol without intravenous contrast. Multiplanar CT image reconstructions of the cervical spine were also generated. COMPARISON:  06/17/2014 FINDINGS: CT HEAD FINDINGS Brain: No acute territorial infarction, hemorrhage or intracranial mass is visualized. Marked atrophy. Moderate small vessel ischemic changes of the white matter. 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 Stable ventricle size. Vascular: No hyperdense vessels.  Carotid artery calcification. Skull: No depressed skull fracture.  Prominent vascular channels. Sinuses/Orbits: Mild mucosal thickening in the ethmoid and left sphenoid  sinus which is hypoplastic. No acute orbital abnormality. Other: Large right frontal scalp hematoma. CT CERVICAL SPINE FINDINGS Alignment: Trace anterolisthesis of C4 on C5 as before. Facet alignment is within normal limits. Skull base and vertebrae: Craniovertebral junction is intact. No fracture. Stable sclerosis posterior C5. Soft tissues and spinal canal: No prevertebral fluid or swelling. No visible canal hematoma. Disc levels: Marked degenerative changes C4 through C6 with partial bony fusion present. Mild degenerative changes elsewhere in the spine. Multiple level facet hypertrophic arthropathy with bilateral foraminal stenosis. Upper chest: Lung apices show scarring at the right apex. Probable right lobectomy of the thyroid. Small hypodense subcentimeter in left lobe thyroid nodules. Other: None IMPRESSION: 1. No CT evidence for acute intracranial abnormality. Atrophy and small vessel ischemic changes of the white matter 2. Large right frontal scalp hematoma 3. Degenerative changes of the cervical spine. No acute osseous abnormality. Electronically Signed   By: Jasmine PangKim  Fujinaga M.D.   On: 01/10/2017 17:16   Ct Pelvis Wo Contrast  Result Date: 01/10/2017 CLINICAL DATA:  Fall with suspected pelvic fracture EXAM: CT PELVIS WITHOUT CONTRAST TECHNIQUE: Multidetector CT imaging of the pelvis was performed following the standard protocol without intravenous contrast. COMPARISON:  01/10/2017 FINDINGS: Urinary Tract:  Bladder is unremarkable. Bowel: Extensive sigmoid colon diverticula without acute inflammation. Vascular/Lymphatic: Extensive vascular calcification. No aneurysmal dilatation. No enlarged pelvic lymph nodes. Reproductive:  Status post hysterectomy.  No adnexal mass. Other:  Negative for free fluid. Musculoskeletal: Bones are osteopenic. This limits the study. The SI joints are non widened. Pubic symphysis is intact. No acute ramus fracture is seen. Both femoral heads project in joint. No definite  acute displaced fracture is seen. Degenerative changes of both hips. IMPRESSION: Bones appear slightly osteopenic. No definitive fracture or dislocation. If clinical suspicion for hip or pelvis fracture remains high, further evaluation with MRI could be obtained. Sigmoid colon diverticular disease without acute inflammation Electronically Signed   By: Jasmine PangKim  Fujinaga M.D.   On: 01/10/2017 21:59   Dg Hip Unilat With Pelvis 2-3 Views Right  Result Date: 01/10/2017 CLINICAL DATA:  Right hip following a fall tonight. EXAM: DG HIP (WITH OR WITHOUT PELVIS) 2-3V RIGHT COMPARISON:  None. FINDINGS: Normal appearing right hip without fracture or dislocation. IMPRESSION: No fracture or dislocation seen. Electronically Signed   By: Beckie SaltsSteven  Reid M.D.   On: 01/10/2017 19:03    Procedures Procedures (including critical care time)  Medications Ordered in ED Medications  acetaminophen (TYLENOL) tablet 1,000 mg (1,000 mg Oral Given 01/10/17 1841)  morphine 4 MG/ML injection 2 mg (2 mg Intravenous Given 01/10/17 2049)     Initial Impression / Assessment and Plan / ED Course  I have reviewed the triage vital signs and the nursing notes.  Pertinent labs & imaging results that were available during my care of the patient were reviewed by  me and considered in my medical decision making (see chart for details).     83 year old female with dementia and afib on Eliquis who presents after unwitnessed fall. Mentation baseline on arrival per patient's daughter in law. Large right scalp hematoma. No focal neuro deficits. Ct head and cervical spine visualized and shows no acute traumatic injuries of the head or neck. We did attempt to ambulate her, but she was complaining of right hip pain. Subsequent x-ray of the right hip and pelvis show acute fracture or other injuries. However she continues to have difficulty with ambulation. Discussed potential CT scan to evaluate for occult fracture, but since patient is DO NOT  RESUSCITATE and hospice discussed whether it this would be necessary for her with patient's daughter-in-law they do not think she would want surgery. Her daughter-in-law did speak with patient's daughter who is out of town and her DPOAE. They state that it would be good to know if she did have fractures that she could be ordered a wheelchair or other rehabilitation measures from hospice/nursing facility. Subsequently a CT pelvis was performed. There is no evidence of occult fracture. Patient will be discharged back to nursing facility under hospice care to continue to undergo PT and pain control. Family agrees with the plan.   Final Clinical Impressions(s) / ED Diagnoses   Final diagnoses:  Hip pain  Fall, initial encounter  Hematoma of scalp, initial encounter    New Prescriptions New Prescriptions   No medications on file     Lavera Guise, MD 01/10/17 2212

## 2017-01-10 NOTE — ED Notes (Signed)
Patient transported to X-ray 

## 2017-01-10 NOTE — Progress Notes (Signed)
[facility]  RN visit  Saw patient/family at bedside.  Patient alert and disoriented (baseline per daughter).  Patient was unable to converse fully.  Patient was in NAD at this time.   Patient had pain when you touched affected area.  Large hematoma and small laceration to R upper head.  Spoke with Dr. Verdie MosherLiu, who advised that if CT comes back okay, the plan is to discharge patient back to facility.    We will continue to monitor patient while hospitalized.  Please feel free to contact me with any questions or concerns.  Thank you.  Ruth BarthelAmy Evans, RN, BSN [facility]iaison [phone]  All hospital liaisons are now on AMION.

## 2017-01-10 NOTE — ED Notes (Signed)
Patient attempted to walk again, needed 2 people to get patient to stand and patient would not put any weight on R hip, pt would not walk, only able to take one step and pivot

## 2017-01-10 NOTE — ED Triage Notes (Signed)
Per EMS is DNR pt was walking around [facility] and fell pt is on eliquis, small laceration on posterior head, pt unable to answer questions normal per nursing home staff,

## 2017-01-11 NOTE — ED Notes (Signed)
Report given to facility.

## 2017-01-18 DIAGNOSIS — I4891 Unspecified atrial fibrillation: Secondary | ICD-10-CM | POA: Diagnosis not present

## 2017-01-18 DIAGNOSIS — E039 Hypothyroidism, unspecified: Secondary | ICD-10-CM | POA: Diagnosis not present

## 2017-01-18 DIAGNOSIS — G309 Alzheimer's disease, unspecified: Secondary | ICD-10-CM | POA: Diagnosis not present

## 2017-01-18 DIAGNOSIS — I10 Essential (primary) hypertension: Secondary | ICD-10-CM | POA: Diagnosis not present

## 2017-01-18 DIAGNOSIS — K219 Gastro-esophageal reflux disease without esophagitis: Secondary | ICD-10-CM | POA: Diagnosis not present

## 2017-01-18 DIAGNOSIS — H409 Unspecified glaucoma: Secondary | ICD-10-CM | POA: Diagnosis not present

## 2017-01-18 DIAGNOSIS — F0281 Dementia in other diseases classified elsewhere with behavioral disturbance: Secondary | ICD-10-CM | POA: Diagnosis not present

## 2017-01-18 DIAGNOSIS — R1310 Dysphagia, unspecified: Secondary | ICD-10-CM | POA: Diagnosis not present

## 2017-01-19 DIAGNOSIS — G309 Alzheimer's disease, unspecified: Secondary | ICD-10-CM | POA: Diagnosis not present

## 2017-01-19 DIAGNOSIS — K219 Gastro-esophageal reflux disease without esophagitis: Secondary | ICD-10-CM | POA: Diagnosis not present

## 2017-01-19 DIAGNOSIS — I10 Essential (primary) hypertension: Secondary | ICD-10-CM | POA: Diagnosis not present

## 2017-01-19 DIAGNOSIS — F0281 Dementia in other diseases classified elsewhere with behavioral disturbance: Secondary | ICD-10-CM | POA: Diagnosis not present

## 2017-01-19 DIAGNOSIS — R1310 Dysphagia, unspecified: Secondary | ICD-10-CM | POA: Diagnosis not present

## 2017-01-19 DIAGNOSIS — I4891 Unspecified atrial fibrillation: Secondary | ICD-10-CM | POA: Diagnosis not present

## 2017-01-21 DIAGNOSIS — F0281 Dementia in other diseases classified elsewhere with behavioral disturbance: Secondary | ICD-10-CM | POA: Diagnosis not present

## 2017-01-21 DIAGNOSIS — R1310 Dysphagia, unspecified: Secondary | ICD-10-CM | POA: Diagnosis not present

## 2017-01-21 DIAGNOSIS — K219 Gastro-esophageal reflux disease without esophagitis: Secondary | ICD-10-CM | POA: Diagnosis not present

## 2017-01-21 DIAGNOSIS — G309 Alzheimer's disease, unspecified: Secondary | ICD-10-CM | POA: Diagnosis not present

## 2017-01-21 DIAGNOSIS — I10 Essential (primary) hypertension: Secondary | ICD-10-CM | POA: Diagnosis not present

## 2017-01-21 DIAGNOSIS — I4891 Unspecified atrial fibrillation: Secondary | ICD-10-CM | POA: Diagnosis not present

## 2017-01-22 DIAGNOSIS — K219 Gastro-esophageal reflux disease without esophagitis: Secondary | ICD-10-CM | POA: Diagnosis not present

## 2017-01-22 DIAGNOSIS — G309 Alzheimer's disease, unspecified: Secondary | ICD-10-CM | POA: Diagnosis not present

## 2017-01-22 DIAGNOSIS — F0281 Dementia in other diseases classified elsewhere with behavioral disturbance: Secondary | ICD-10-CM | POA: Diagnosis not present

## 2017-01-22 DIAGNOSIS — I10 Essential (primary) hypertension: Secondary | ICD-10-CM | POA: Diagnosis not present

## 2017-01-22 DIAGNOSIS — I4891 Unspecified atrial fibrillation: Secondary | ICD-10-CM | POA: Diagnosis not present

## 2017-01-22 DIAGNOSIS — R1310 Dysphagia, unspecified: Secondary | ICD-10-CM | POA: Diagnosis not present

## 2017-01-23 DIAGNOSIS — F0281 Dementia in other diseases classified elsewhere with behavioral disturbance: Secondary | ICD-10-CM | POA: Diagnosis not present

## 2017-01-23 DIAGNOSIS — I4891 Unspecified atrial fibrillation: Secondary | ICD-10-CM | POA: Diagnosis not present

## 2017-01-23 DIAGNOSIS — K219 Gastro-esophageal reflux disease without esophagitis: Secondary | ICD-10-CM | POA: Diagnosis not present

## 2017-01-23 DIAGNOSIS — G309 Alzheimer's disease, unspecified: Secondary | ICD-10-CM | POA: Diagnosis not present

## 2017-01-23 DIAGNOSIS — R1310 Dysphagia, unspecified: Secondary | ICD-10-CM | POA: Diagnosis not present

## 2017-01-23 DIAGNOSIS — I10 Essential (primary) hypertension: Secondary | ICD-10-CM | POA: Diagnosis not present

## 2017-01-26 DIAGNOSIS — I10 Essential (primary) hypertension: Secondary | ICD-10-CM | POA: Diagnosis not present

## 2017-01-26 DIAGNOSIS — R1310 Dysphagia, unspecified: Secondary | ICD-10-CM | POA: Diagnosis not present

## 2017-01-26 DIAGNOSIS — I4891 Unspecified atrial fibrillation: Secondary | ICD-10-CM | POA: Diagnosis not present

## 2017-01-26 DIAGNOSIS — G309 Alzheimer's disease, unspecified: Secondary | ICD-10-CM | POA: Diagnosis not present

## 2017-01-26 DIAGNOSIS — F0281 Dementia in other diseases classified elsewhere with behavioral disturbance: Secondary | ICD-10-CM | POA: Diagnosis not present

## 2017-01-26 DIAGNOSIS — K219 Gastro-esophageal reflux disease without esophagitis: Secondary | ICD-10-CM | POA: Diagnosis not present

## 2017-01-29 DIAGNOSIS — I4891 Unspecified atrial fibrillation: Secondary | ICD-10-CM | POA: Diagnosis not present

## 2017-01-29 DIAGNOSIS — I10 Essential (primary) hypertension: Secondary | ICD-10-CM | POA: Diagnosis not present

## 2017-01-29 DIAGNOSIS — K219 Gastro-esophageal reflux disease without esophagitis: Secondary | ICD-10-CM | POA: Diagnosis not present

## 2017-01-29 DIAGNOSIS — R1310 Dysphagia, unspecified: Secondary | ICD-10-CM | POA: Diagnosis not present

## 2017-01-29 DIAGNOSIS — F0281 Dementia in other diseases classified elsewhere with behavioral disturbance: Secondary | ICD-10-CM | POA: Diagnosis not present

## 2017-01-29 DIAGNOSIS — G309 Alzheimer's disease, unspecified: Secondary | ICD-10-CM | POA: Diagnosis not present

## 2017-01-30 DIAGNOSIS — N390 Urinary tract infection, site not specified: Secondary | ICD-10-CM | POA: Diagnosis not present

## 2017-01-30 DIAGNOSIS — I4891 Unspecified atrial fibrillation: Secondary | ICD-10-CM | POA: Diagnosis not present

## 2017-01-30 DIAGNOSIS — K219 Gastro-esophageal reflux disease without esophagitis: Secondary | ICD-10-CM | POA: Diagnosis not present

## 2017-01-30 DIAGNOSIS — I10 Essential (primary) hypertension: Secondary | ICD-10-CM | POA: Diagnosis not present

## 2017-01-30 DIAGNOSIS — G309 Alzheimer's disease, unspecified: Secondary | ICD-10-CM | POA: Diagnosis not present

## 2017-01-30 DIAGNOSIS — R1310 Dysphagia, unspecified: Secondary | ICD-10-CM | POA: Diagnosis not present

## 2017-01-30 DIAGNOSIS — F0281 Dementia in other diseases classified elsewhere with behavioral disturbance: Secondary | ICD-10-CM | POA: Diagnosis not present

## 2017-01-31 DIAGNOSIS — I10 Essential (primary) hypertension: Secondary | ICD-10-CM | POA: Diagnosis not present

## 2017-01-31 DIAGNOSIS — K219 Gastro-esophageal reflux disease without esophagitis: Secondary | ICD-10-CM | POA: Diagnosis not present

## 2017-01-31 DIAGNOSIS — R1310 Dysphagia, unspecified: Secondary | ICD-10-CM | POA: Diagnosis not present

## 2017-01-31 DIAGNOSIS — I4891 Unspecified atrial fibrillation: Secondary | ICD-10-CM | POA: Diagnosis not present

## 2017-01-31 DIAGNOSIS — F0281 Dementia in other diseases classified elsewhere with behavioral disturbance: Secondary | ICD-10-CM | POA: Diagnosis not present

## 2017-01-31 DIAGNOSIS — G309 Alzheimer's disease, unspecified: Secondary | ICD-10-CM | POA: Diagnosis not present

## 2017-02-02 DIAGNOSIS — I4891 Unspecified atrial fibrillation: Secondary | ICD-10-CM | POA: Diagnosis not present

## 2017-02-02 DIAGNOSIS — K219 Gastro-esophageal reflux disease without esophagitis: Secondary | ICD-10-CM | POA: Diagnosis not present

## 2017-02-02 DIAGNOSIS — G309 Alzheimer's disease, unspecified: Secondary | ICD-10-CM | POA: Diagnosis not present

## 2017-02-02 DIAGNOSIS — R1310 Dysphagia, unspecified: Secondary | ICD-10-CM | POA: Diagnosis not present

## 2017-02-02 DIAGNOSIS — I10 Essential (primary) hypertension: Secondary | ICD-10-CM | POA: Diagnosis not present

## 2017-02-02 DIAGNOSIS — F0281 Dementia in other diseases classified elsewhere with behavioral disturbance: Secondary | ICD-10-CM | POA: Diagnosis not present

## 2017-02-06 DIAGNOSIS — I4891 Unspecified atrial fibrillation: Secondary | ICD-10-CM | POA: Diagnosis not present

## 2017-02-06 DIAGNOSIS — F0281 Dementia in other diseases classified elsewhere with behavioral disturbance: Secondary | ICD-10-CM | POA: Diagnosis not present

## 2017-02-06 DIAGNOSIS — I10 Essential (primary) hypertension: Secondary | ICD-10-CM | POA: Diagnosis not present

## 2017-02-06 DIAGNOSIS — G309 Alzheimer's disease, unspecified: Secondary | ICD-10-CM | POA: Diagnosis not present

## 2017-02-06 DIAGNOSIS — K219 Gastro-esophageal reflux disease without esophagitis: Secondary | ICD-10-CM | POA: Diagnosis not present

## 2017-02-06 DIAGNOSIS — R1310 Dysphagia, unspecified: Secondary | ICD-10-CM | POA: Diagnosis not present

## 2017-02-07 DIAGNOSIS — B351 Tinea unguium: Secondary | ICD-10-CM | POA: Diagnosis not present

## 2017-02-07 DIAGNOSIS — I739 Peripheral vascular disease, unspecified: Secondary | ICD-10-CM | POA: Diagnosis not present

## 2017-02-07 DIAGNOSIS — L853 Xerosis cutis: Secondary | ICD-10-CM | POA: Diagnosis not present

## 2017-02-07 DIAGNOSIS — Q845 Enlarged and hypertrophic nails: Secondary | ICD-10-CM | POA: Diagnosis not present

## 2017-02-07 DIAGNOSIS — L603 Nail dystrophy: Secondary | ICD-10-CM | POA: Diagnosis not present

## 2017-02-07 DIAGNOSIS — M2010 Hallux valgus (acquired), unspecified foot: Secondary | ICD-10-CM | POA: Diagnosis not present

## 2017-02-09 DIAGNOSIS — I4891 Unspecified atrial fibrillation: Secondary | ICD-10-CM | POA: Diagnosis not present

## 2017-02-09 DIAGNOSIS — F0281 Dementia in other diseases classified elsewhere with behavioral disturbance: Secondary | ICD-10-CM | POA: Diagnosis not present

## 2017-02-09 DIAGNOSIS — G309 Alzheimer's disease, unspecified: Secondary | ICD-10-CM | POA: Diagnosis not present

## 2017-02-09 DIAGNOSIS — R1310 Dysphagia, unspecified: Secondary | ICD-10-CM | POA: Diagnosis not present

## 2017-02-09 DIAGNOSIS — I10 Essential (primary) hypertension: Secondary | ICD-10-CM | POA: Diagnosis not present

## 2017-02-09 DIAGNOSIS — K219 Gastro-esophageal reflux disease without esophagitis: Secondary | ICD-10-CM | POA: Diagnosis not present

## 2017-02-12 DIAGNOSIS — I481 Persistent atrial fibrillation: Secondary | ICD-10-CM | POA: Diagnosis not present

## 2017-02-12 DIAGNOSIS — G301 Alzheimer's disease with late onset: Secondary | ICD-10-CM | POA: Diagnosis not present

## 2017-02-12 DIAGNOSIS — F0391 Unspecified dementia with behavioral disturbance: Secondary | ICD-10-CM | POA: Diagnosis not present

## 2017-02-12 DIAGNOSIS — N183 Chronic kidney disease, stage 3 (moderate): Secondary | ICD-10-CM | POA: Diagnosis not present

## 2017-02-12 DIAGNOSIS — I480 Paroxysmal atrial fibrillation: Secondary | ICD-10-CM | POA: Diagnosis not present

## 2017-02-12 DIAGNOSIS — I129 Hypertensive chronic kidney disease with stage 1 through stage 4 chronic kidney disease, or unspecified chronic kidney disease: Secondary | ICD-10-CM | POA: Diagnosis not present

## 2017-02-12 DIAGNOSIS — E039 Hypothyroidism, unspecified: Secondary | ICD-10-CM | POA: Diagnosis not present

## 2017-02-13 DIAGNOSIS — I10 Essential (primary) hypertension: Secondary | ICD-10-CM | POA: Diagnosis not present

## 2017-02-13 DIAGNOSIS — G309 Alzheimer's disease, unspecified: Secondary | ICD-10-CM | POA: Diagnosis not present

## 2017-02-13 DIAGNOSIS — R1310 Dysphagia, unspecified: Secondary | ICD-10-CM | POA: Diagnosis not present

## 2017-02-13 DIAGNOSIS — F0281 Dementia in other diseases classified elsewhere with behavioral disturbance: Secondary | ICD-10-CM | POA: Diagnosis not present

## 2017-02-13 DIAGNOSIS — I4891 Unspecified atrial fibrillation: Secondary | ICD-10-CM | POA: Diagnosis not present

## 2017-02-13 DIAGNOSIS — K219 Gastro-esophageal reflux disease without esophagitis: Secondary | ICD-10-CM | POA: Diagnosis not present

## 2017-02-14 DIAGNOSIS — R1310 Dysphagia, unspecified: Secondary | ICD-10-CM | POA: Diagnosis not present

## 2017-02-14 DIAGNOSIS — I4891 Unspecified atrial fibrillation: Secondary | ICD-10-CM | POA: Diagnosis not present

## 2017-02-14 DIAGNOSIS — G309 Alzheimer's disease, unspecified: Secondary | ICD-10-CM | POA: Diagnosis not present

## 2017-02-14 DIAGNOSIS — I10 Essential (primary) hypertension: Secondary | ICD-10-CM | POA: Diagnosis not present

## 2017-02-14 DIAGNOSIS — K219 Gastro-esophageal reflux disease without esophagitis: Secondary | ICD-10-CM | POA: Diagnosis not present

## 2017-02-14 DIAGNOSIS — F0281 Dementia in other diseases classified elsewhere with behavioral disturbance: Secondary | ICD-10-CM | POA: Diagnosis not present

## 2017-02-16 DIAGNOSIS — K219 Gastro-esophageal reflux disease without esophagitis: Secondary | ICD-10-CM | POA: Diagnosis not present

## 2017-02-16 DIAGNOSIS — G309 Alzheimer's disease, unspecified: Secondary | ICD-10-CM | POA: Diagnosis not present

## 2017-02-16 DIAGNOSIS — I4891 Unspecified atrial fibrillation: Secondary | ICD-10-CM | POA: Diagnosis not present

## 2017-02-16 DIAGNOSIS — R1310 Dysphagia, unspecified: Secondary | ICD-10-CM | POA: Diagnosis not present

## 2017-02-16 DIAGNOSIS — F0281 Dementia in other diseases classified elsewhere with behavioral disturbance: Secondary | ICD-10-CM | POA: Diagnosis not present

## 2017-02-16 DIAGNOSIS — I10 Essential (primary) hypertension: Secondary | ICD-10-CM | POA: Diagnosis not present

## 2017-02-17 DIAGNOSIS — K219 Gastro-esophageal reflux disease without esophagitis: Secondary | ICD-10-CM | POA: Diagnosis not present

## 2017-02-17 DIAGNOSIS — I10 Essential (primary) hypertension: Secondary | ICD-10-CM | POA: Diagnosis not present

## 2017-02-17 DIAGNOSIS — F0281 Dementia in other diseases classified elsewhere with behavioral disturbance: Secondary | ICD-10-CM | POA: Diagnosis not present

## 2017-02-17 DIAGNOSIS — G309 Alzheimer's disease, unspecified: Secondary | ICD-10-CM | POA: Diagnosis not present

## 2017-02-17 DIAGNOSIS — E039 Hypothyroidism, unspecified: Secondary | ICD-10-CM | POA: Diagnosis not present

## 2017-02-17 DIAGNOSIS — I4891 Unspecified atrial fibrillation: Secondary | ICD-10-CM | POA: Diagnosis not present

## 2017-02-17 DIAGNOSIS — R1310 Dysphagia, unspecified: Secondary | ICD-10-CM | POA: Diagnosis not present

## 2017-02-17 DIAGNOSIS — H409 Unspecified glaucoma: Secondary | ICD-10-CM | POA: Diagnosis not present

## 2017-02-19 DIAGNOSIS — I4891 Unspecified atrial fibrillation: Secondary | ICD-10-CM | POA: Diagnosis not present

## 2017-02-19 DIAGNOSIS — K219 Gastro-esophageal reflux disease without esophagitis: Secondary | ICD-10-CM | POA: Diagnosis not present

## 2017-02-19 DIAGNOSIS — F0281 Dementia in other diseases classified elsewhere with behavioral disturbance: Secondary | ICD-10-CM | POA: Diagnosis not present

## 2017-02-19 DIAGNOSIS — G309 Alzheimer's disease, unspecified: Secondary | ICD-10-CM | POA: Diagnosis not present

## 2017-02-19 DIAGNOSIS — I10 Essential (primary) hypertension: Secondary | ICD-10-CM | POA: Diagnosis not present

## 2017-02-19 DIAGNOSIS — R1310 Dysphagia, unspecified: Secondary | ICD-10-CM | POA: Diagnosis not present

## 2017-02-20 DIAGNOSIS — I10 Essential (primary) hypertension: Secondary | ICD-10-CM | POA: Diagnosis not present

## 2017-02-20 DIAGNOSIS — I4891 Unspecified atrial fibrillation: Secondary | ICD-10-CM | POA: Diagnosis not present

## 2017-02-20 DIAGNOSIS — F0281 Dementia in other diseases classified elsewhere with behavioral disturbance: Secondary | ICD-10-CM | POA: Diagnosis not present

## 2017-02-20 DIAGNOSIS — K219 Gastro-esophageal reflux disease without esophagitis: Secondary | ICD-10-CM | POA: Diagnosis not present

## 2017-02-20 DIAGNOSIS — R1310 Dysphagia, unspecified: Secondary | ICD-10-CM | POA: Diagnosis not present

## 2017-02-20 DIAGNOSIS — G309 Alzheimer's disease, unspecified: Secondary | ICD-10-CM | POA: Diagnosis not present

## 2017-02-21 DIAGNOSIS — I4891 Unspecified atrial fibrillation: Secondary | ICD-10-CM | POA: Diagnosis not present

## 2017-02-21 DIAGNOSIS — F0281 Dementia in other diseases classified elsewhere with behavioral disturbance: Secondary | ICD-10-CM | POA: Diagnosis not present

## 2017-02-21 DIAGNOSIS — G309 Alzheimer's disease, unspecified: Secondary | ICD-10-CM | POA: Diagnosis not present

## 2017-02-21 DIAGNOSIS — I10 Essential (primary) hypertension: Secondary | ICD-10-CM | POA: Diagnosis not present

## 2017-02-21 DIAGNOSIS — R1310 Dysphagia, unspecified: Secondary | ICD-10-CM | POA: Diagnosis not present

## 2017-02-21 DIAGNOSIS — K219 Gastro-esophageal reflux disease without esophagitis: Secondary | ICD-10-CM | POA: Diagnosis not present

## 2017-02-22 DIAGNOSIS — R1310 Dysphagia, unspecified: Secondary | ICD-10-CM | POA: Diagnosis not present

## 2017-02-22 DIAGNOSIS — I4891 Unspecified atrial fibrillation: Secondary | ICD-10-CM | POA: Diagnosis not present

## 2017-02-22 DIAGNOSIS — G309 Alzheimer's disease, unspecified: Secondary | ICD-10-CM | POA: Diagnosis not present

## 2017-02-22 DIAGNOSIS — I10 Essential (primary) hypertension: Secondary | ICD-10-CM | POA: Diagnosis not present

## 2017-02-22 DIAGNOSIS — F0281 Dementia in other diseases classified elsewhere with behavioral disturbance: Secondary | ICD-10-CM | POA: Diagnosis not present

## 2017-02-22 DIAGNOSIS — K219 Gastro-esophageal reflux disease without esophagitis: Secondary | ICD-10-CM | POA: Diagnosis not present

## 2017-02-23 DIAGNOSIS — K219 Gastro-esophageal reflux disease without esophagitis: Secondary | ICD-10-CM | POA: Diagnosis not present

## 2017-02-23 DIAGNOSIS — R1310 Dysphagia, unspecified: Secondary | ICD-10-CM | POA: Diagnosis not present

## 2017-02-23 DIAGNOSIS — I10 Essential (primary) hypertension: Secondary | ICD-10-CM | POA: Diagnosis not present

## 2017-02-23 DIAGNOSIS — F0281 Dementia in other diseases classified elsewhere with behavioral disturbance: Secondary | ICD-10-CM | POA: Diagnosis not present

## 2017-02-23 DIAGNOSIS — G309 Alzheimer's disease, unspecified: Secondary | ICD-10-CM | POA: Diagnosis not present

## 2017-02-23 DIAGNOSIS — I4891 Unspecified atrial fibrillation: Secondary | ICD-10-CM | POA: Diagnosis not present

## 2017-02-24 DIAGNOSIS — F0281 Dementia in other diseases classified elsewhere with behavioral disturbance: Secondary | ICD-10-CM | POA: Diagnosis not present

## 2017-02-24 DIAGNOSIS — R1310 Dysphagia, unspecified: Secondary | ICD-10-CM | POA: Diagnosis not present

## 2017-02-24 DIAGNOSIS — G309 Alzheimer's disease, unspecified: Secondary | ICD-10-CM | POA: Diagnosis not present

## 2017-02-24 DIAGNOSIS — K219 Gastro-esophageal reflux disease without esophagitis: Secondary | ICD-10-CM | POA: Diagnosis not present

## 2017-02-24 DIAGNOSIS — I10 Essential (primary) hypertension: Secondary | ICD-10-CM | POA: Diagnosis not present

## 2017-02-24 DIAGNOSIS — I4891 Unspecified atrial fibrillation: Secondary | ICD-10-CM | POA: Diagnosis not present

## 2017-02-27 DIAGNOSIS — G309 Alzheimer's disease, unspecified: Secondary | ICD-10-CM | POA: Diagnosis not present

## 2017-02-27 DIAGNOSIS — F0281 Dementia in other diseases classified elsewhere with behavioral disturbance: Secondary | ICD-10-CM | POA: Diagnosis not present

## 2017-02-27 DIAGNOSIS — R1310 Dysphagia, unspecified: Secondary | ICD-10-CM | POA: Diagnosis not present

## 2017-02-27 DIAGNOSIS — K219 Gastro-esophageal reflux disease without esophagitis: Secondary | ICD-10-CM | POA: Diagnosis not present

## 2017-02-27 DIAGNOSIS — I10 Essential (primary) hypertension: Secondary | ICD-10-CM | POA: Diagnosis not present

## 2017-02-27 DIAGNOSIS — I4891 Unspecified atrial fibrillation: Secondary | ICD-10-CM | POA: Diagnosis not present

## 2017-03-02 DIAGNOSIS — I4891 Unspecified atrial fibrillation: Secondary | ICD-10-CM | POA: Diagnosis not present

## 2017-03-02 DIAGNOSIS — R1310 Dysphagia, unspecified: Secondary | ICD-10-CM | POA: Diagnosis not present

## 2017-03-02 DIAGNOSIS — F0281 Dementia in other diseases classified elsewhere with behavioral disturbance: Secondary | ICD-10-CM | POA: Diagnosis not present

## 2017-03-02 DIAGNOSIS — G309 Alzheimer's disease, unspecified: Secondary | ICD-10-CM | POA: Diagnosis not present

## 2017-03-02 DIAGNOSIS — I10 Essential (primary) hypertension: Secondary | ICD-10-CM | POA: Diagnosis not present

## 2017-03-02 DIAGNOSIS — K219 Gastro-esophageal reflux disease without esophagitis: Secondary | ICD-10-CM | POA: Diagnosis not present

## 2017-03-05 DIAGNOSIS — G309 Alzheimer's disease, unspecified: Secondary | ICD-10-CM | POA: Diagnosis not present

## 2017-03-05 DIAGNOSIS — R1310 Dysphagia, unspecified: Secondary | ICD-10-CM | POA: Diagnosis not present

## 2017-03-05 DIAGNOSIS — I10 Essential (primary) hypertension: Secondary | ICD-10-CM | POA: Diagnosis not present

## 2017-03-05 DIAGNOSIS — K219 Gastro-esophageal reflux disease without esophagitis: Secondary | ICD-10-CM | POA: Diagnosis not present

## 2017-03-05 DIAGNOSIS — I4891 Unspecified atrial fibrillation: Secondary | ICD-10-CM | POA: Diagnosis not present

## 2017-03-05 DIAGNOSIS — F0281 Dementia in other diseases classified elsewhere with behavioral disturbance: Secondary | ICD-10-CM | POA: Diagnosis not present

## 2017-03-06 DIAGNOSIS — F0281 Dementia in other diseases classified elsewhere with behavioral disturbance: Secondary | ICD-10-CM | POA: Diagnosis not present

## 2017-03-06 DIAGNOSIS — G309 Alzheimer's disease, unspecified: Secondary | ICD-10-CM | POA: Diagnosis not present

## 2017-03-06 DIAGNOSIS — K219 Gastro-esophageal reflux disease without esophagitis: Secondary | ICD-10-CM | POA: Diagnosis not present

## 2017-03-06 DIAGNOSIS — I10 Essential (primary) hypertension: Secondary | ICD-10-CM | POA: Diagnosis not present

## 2017-03-06 DIAGNOSIS — R1310 Dysphagia, unspecified: Secondary | ICD-10-CM | POA: Diagnosis not present

## 2017-03-06 DIAGNOSIS — I4891 Unspecified atrial fibrillation: Secondary | ICD-10-CM | POA: Diagnosis not present

## 2017-03-07 DIAGNOSIS — R1310 Dysphagia, unspecified: Secondary | ICD-10-CM | POA: Diagnosis not present

## 2017-03-07 DIAGNOSIS — G309 Alzheimer's disease, unspecified: Secondary | ICD-10-CM | POA: Diagnosis not present

## 2017-03-07 DIAGNOSIS — F0281 Dementia in other diseases classified elsewhere with behavioral disturbance: Secondary | ICD-10-CM | POA: Diagnosis not present

## 2017-03-07 DIAGNOSIS — I10 Essential (primary) hypertension: Secondary | ICD-10-CM | POA: Diagnosis not present

## 2017-03-07 DIAGNOSIS — K219 Gastro-esophageal reflux disease without esophagitis: Secondary | ICD-10-CM | POA: Diagnosis not present

## 2017-03-07 DIAGNOSIS — I4891 Unspecified atrial fibrillation: Secondary | ICD-10-CM | POA: Diagnosis not present

## 2017-03-09 DIAGNOSIS — I4891 Unspecified atrial fibrillation: Secondary | ICD-10-CM | POA: Diagnosis not present

## 2017-03-09 DIAGNOSIS — F0281 Dementia in other diseases classified elsewhere with behavioral disturbance: Secondary | ICD-10-CM | POA: Diagnosis not present

## 2017-03-09 DIAGNOSIS — I10 Essential (primary) hypertension: Secondary | ICD-10-CM | POA: Diagnosis not present

## 2017-03-09 DIAGNOSIS — R1310 Dysphagia, unspecified: Secondary | ICD-10-CM | POA: Diagnosis not present

## 2017-03-09 DIAGNOSIS — K219 Gastro-esophageal reflux disease without esophagitis: Secondary | ICD-10-CM | POA: Diagnosis not present

## 2017-03-09 DIAGNOSIS — G309 Alzheimer's disease, unspecified: Secondary | ICD-10-CM | POA: Diagnosis not present

## 2017-03-10 DIAGNOSIS — G309 Alzheimer's disease, unspecified: Secondary | ICD-10-CM | POA: Diagnosis not present

## 2017-03-10 DIAGNOSIS — I10 Essential (primary) hypertension: Secondary | ICD-10-CM | POA: Diagnosis not present

## 2017-03-10 DIAGNOSIS — F0281 Dementia in other diseases classified elsewhere with behavioral disturbance: Secondary | ICD-10-CM | POA: Diagnosis not present

## 2017-03-10 DIAGNOSIS — I4891 Unspecified atrial fibrillation: Secondary | ICD-10-CM | POA: Diagnosis not present

## 2017-03-10 DIAGNOSIS — K219 Gastro-esophageal reflux disease without esophagitis: Secondary | ICD-10-CM | POA: Diagnosis not present

## 2017-03-10 DIAGNOSIS — R1310 Dysphagia, unspecified: Secondary | ICD-10-CM | POA: Diagnosis not present

## 2017-03-11 DIAGNOSIS — R1310 Dysphagia, unspecified: Secondary | ICD-10-CM | POA: Diagnosis not present

## 2017-03-11 DIAGNOSIS — F0281 Dementia in other diseases classified elsewhere with behavioral disturbance: Secondary | ICD-10-CM | POA: Diagnosis not present

## 2017-03-11 DIAGNOSIS — I4891 Unspecified atrial fibrillation: Secondary | ICD-10-CM | POA: Diagnosis not present

## 2017-03-11 DIAGNOSIS — I10 Essential (primary) hypertension: Secondary | ICD-10-CM | POA: Diagnosis not present

## 2017-03-11 DIAGNOSIS — K219 Gastro-esophageal reflux disease without esophagitis: Secondary | ICD-10-CM | POA: Diagnosis not present

## 2017-03-11 DIAGNOSIS — G309 Alzheimer's disease, unspecified: Secondary | ICD-10-CM | POA: Diagnosis not present

## 2017-03-12 DIAGNOSIS — G309 Alzheimer's disease, unspecified: Secondary | ICD-10-CM | POA: Diagnosis not present

## 2017-03-12 DIAGNOSIS — K219 Gastro-esophageal reflux disease without esophagitis: Secondary | ICD-10-CM | POA: Diagnosis not present

## 2017-03-12 DIAGNOSIS — F0281 Dementia in other diseases classified elsewhere with behavioral disturbance: Secondary | ICD-10-CM | POA: Diagnosis not present

## 2017-03-12 DIAGNOSIS — I10 Essential (primary) hypertension: Secondary | ICD-10-CM | POA: Diagnosis not present

## 2017-03-12 DIAGNOSIS — I4891 Unspecified atrial fibrillation: Secondary | ICD-10-CM | POA: Diagnosis not present

## 2017-03-12 DIAGNOSIS — R1310 Dysphagia, unspecified: Secondary | ICD-10-CM | POA: Diagnosis not present

## 2017-03-16 DIAGNOSIS — K219 Gastro-esophageal reflux disease without esophagitis: Secondary | ICD-10-CM | POA: Diagnosis not present

## 2017-03-16 DIAGNOSIS — I4891 Unspecified atrial fibrillation: Secondary | ICD-10-CM | POA: Diagnosis not present

## 2017-03-16 DIAGNOSIS — F0281 Dementia in other diseases classified elsewhere with behavioral disturbance: Secondary | ICD-10-CM | POA: Diagnosis not present

## 2017-03-16 DIAGNOSIS — I10 Essential (primary) hypertension: Secondary | ICD-10-CM | POA: Diagnosis not present

## 2017-03-16 DIAGNOSIS — R1310 Dysphagia, unspecified: Secondary | ICD-10-CM | POA: Diagnosis not present

## 2017-03-16 DIAGNOSIS — G309 Alzheimer's disease, unspecified: Secondary | ICD-10-CM | POA: Diagnosis not present

## 2017-03-19 DIAGNOSIS — I10 Essential (primary) hypertension: Secondary | ICD-10-CM | POA: Diagnosis not present

## 2017-03-19 DIAGNOSIS — G309 Alzheimer's disease, unspecified: Secondary | ICD-10-CM | POA: Diagnosis not present

## 2017-03-19 DIAGNOSIS — F0281 Dementia in other diseases classified elsewhere with behavioral disturbance: Secondary | ICD-10-CM | POA: Diagnosis not present

## 2017-03-19 DIAGNOSIS — I4891 Unspecified atrial fibrillation: Secondary | ICD-10-CM | POA: Diagnosis not present

## 2017-03-19 DIAGNOSIS — K219 Gastro-esophageal reflux disease without esophagitis: Secondary | ICD-10-CM | POA: Diagnosis not present

## 2017-03-19 DIAGNOSIS — R1310 Dysphagia, unspecified: Secondary | ICD-10-CM | POA: Diagnosis not present

## 2017-03-20 DIAGNOSIS — I10 Essential (primary) hypertension: Secondary | ICD-10-CM | POA: Diagnosis not present

## 2017-03-20 DIAGNOSIS — E039 Hypothyroidism, unspecified: Secondary | ICD-10-CM | POA: Diagnosis not present

## 2017-03-20 DIAGNOSIS — R1310 Dysphagia, unspecified: Secondary | ICD-10-CM | POA: Diagnosis not present

## 2017-03-20 DIAGNOSIS — I4891 Unspecified atrial fibrillation: Secondary | ICD-10-CM | POA: Diagnosis not present

## 2017-03-20 DIAGNOSIS — G309 Alzheimer's disease, unspecified: Secondary | ICD-10-CM | POA: Diagnosis not present

## 2017-03-20 DIAGNOSIS — K219 Gastro-esophageal reflux disease without esophagitis: Secondary | ICD-10-CM | POA: Diagnosis not present

## 2017-03-20 DIAGNOSIS — H409 Unspecified glaucoma: Secondary | ICD-10-CM | POA: Diagnosis not present

## 2017-03-20 DIAGNOSIS — F0281 Dementia in other diseases classified elsewhere with behavioral disturbance: Secondary | ICD-10-CM | POA: Diagnosis not present

## 2017-03-22 DIAGNOSIS — G309 Alzheimer's disease, unspecified: Secondary | ICD-10-CM | POA: Diagnosis not present

## 2017-03-22 DIAGNOSIS — K219 Gastro-esophageal reflux disease without esophagitis: Secondary | ICD-10-CM | POA: Diagnosis not present

## 2017-03-22 DIAGNOSIS — I4891 Unspecified atrial fibrillation: Secondary | ICD-10-CM | POA: Diagnosis not present

## 2017-03-22 DIAGNOSIS — R1310 Dysphagia, unspecified: Secondary | ICD-10-CM | POA: Diagnosis not present

## 2017-03-22 DIAGNOSIS — F0281 Dementia in other diseases classified elsewhere with behavioral disturbance: Secondary | ICD-10-CM | POA: Diagnosis not present

## 2017-03-22 DIAGNOSIS — I10 Essential (primary) hypertension: Secondary | ICD-10-CM | POA: Diagnosis not present

## 2017-03-23 DIAGNOSIS — F0281 Dementia in other diseases classified elsewhere with behavioral disturbance: Secondary | ICD-10-CM | POA: Diagnosis not present

## 2017-03-23 DIAGNOSIS — R1310 Dysphagia, unspecified: Secondary | ICD-10-CM | POA: Diagnosis not present

## 2017-03-23 DIAGNOSIS — I10 Essential (primary) hypertension: Secondary | ICD-10-CM | POA: Diagnosis not present

## 2017-03-23 DIAGNOSIS — K219 Gastro-esophageal reflux disease without esophagitis: Secondary | ICD-10-CM | POA: Diagnosis not present

## 2017-03-23 DIAGNOSIS — G309 Alzheimer's disease, unspecified: Secondary | ICD-10-CM | POA: Diagnosis not present

## 2017-03-23 DIAGNOSIS — I4891 Unspecified atrial fibrillation: Secondary | ICD-10-CM | POA: Diagnosis not present

## 2017-03-26 DIAGNOSIS — R1310 Dysphagia, unspecified: Secondary | ICD-10-CM | POA: Diagnosis not present

## 2017-03-26 DIAGNOSIS — I4891 Unspecified atrial fibrillation: Secondary | ICD-10-CM | POA: Diagnosis not present

## 2017-03-26 DIAGNOSIS — I10 Essential (primary) hypertension: Secondary | ICD-10-CM | POA: Diagnosis not present

## 2017-03-26 DIAGNOSIS — F0281 Dementia in other diseases classified elsewhere with behavioral disturbance: Secondary | ICD-10-CM | POA: Diagnosis not present

## 2017-03-26 DIAGNOSIS — K219 Gastro-esophageal reflux disease without esophagitis: Secondary | ICD-10-CM | POA: Diagnosis not present

## 2017-03-26 DIAGNOSIS — G309 Alzheimer's disease, unspecified: Secondary | ICD-10-CM | POA: Diagnosis not present

## 2017-03-27 DIAGNOSIS — I10 Essential (primary) hypertension: Secondary | ICD-10-CM | POA: Diagnosis not present

## 2017-03-27 DIAGNOSIS — F0281 Dementia in other diseases classified elsewhere with behavioral disturbance: Secondary | ICD-10-CM | POA: Diagnosis not present

## 2017-03-27 DIAGNOSIS — R1310 Dysphagia, unspecified: Secondary | ICD-10-CM | POA: Diagnosis not present

## 2017-03-27 DIAGNOSIS — K219 Gastro-esophageal reflux disease without esophagitis: Secondary | ICD-10-CM | POA: Diagnosis not present

## 2017-03-27 DIAGNOSIS — G309 Alzheimer's disease, unspecified: Secondary | ICD-10-CM | POA: Diagnosis not present

## 2017-03-27 DIAGNOSIS — I4891 Unspecified atrial fibrillation: Secondary | ICD-10-CM | POA: Diagnosis not present

## 2017-03-29 DIAGNOSIS — R1310 Dysphagia, unspecified: Secondary | ICD-10-CM | POA: Diagnosis not present

## 2017-03-29 DIAGNOSIS — F0281 Dementia in other diseases classified elsewhere with behavioral disturbance: Secondary | ICD-10-CM | POA: Diagnosis not present

## 2017-03-29 DIAGNOSIS — G309 Alzheimer's disease, unspecified: Secondary | ICD-10-CM | POA: Diagnosis not present

## 2017-03-29 DIAGNOSIS — K219 Gastro-esophageal reflux disease without esophagitis: Secondary | ICD-10-CM | POA: Diagnosis not present

## 2017-03-29 DIAGNOSIS — I4891 Unspecified atrial fibrillation: Secondary | ICD-10-CM | POA: Diagnosis not present

## 2017-03-29 DIAGNOSIS — I10 Essential (primary) hypertension: Secondary | ICD-10-CM | POA: Diagnosis not present

## 2017-04-03 DIAGNOSIS — I4891 Unspecified atrial fibrillation: Secondary | ICD-10-CM | POA: Diagnosis not present

## 2017-04-03 DIAGNOSIS — K219 Gastro-esophageal reflux disease without esophagitis: Secondary | ICD-10-CM | POA: Diagnosis not present

## 2017-04-03 DIAGNOSIS — R1310 Dysphagia, unspecified: Secondary | ICD-10-CM | POA: Diagnosis not present

## 2017-04-03 DIAGNOSIS — I10 Essential (primary) hypertension: Secondary | ICD-10-CM | POA: Diagnosis not present

## 2017-04-03 DIAGNOSIS — F0281 Dementia in other diseases classified elsewhere with behavioral disturbance: Secondary | ICD-10-CM | POA: Diagnosis not present

## 2017-04-03 DIAGNOSIS — G309 Alzheimer's disease, unspecified: Secondary | ICD-10-CM | POA: Diagnosis not present

## 2017-04-05 DIAGNOSIS — F0281 Dementia in other diseases classified elsewhere with behavioral disturbance: Secondary | ICD-10-CM | POA: Diagnosis not present

## 2017-04-05 DIAGNOSIS — I10 Essential (primary) hypertension: Secondary | ICD-10-CM | POA: Diagnosis not present

## 2017-04-05 DIAGNOSIS — I4891 Unspecified atrial fibrillation: Secondary | ICD-10-CM | POA: Diagnosis not present

## 2017-04-05 DIAGNOSIS — K219 Gastro-esophageal reflux disease without esophagitis: Secondary | ICD-10-CM | POA: Diagnosis not present

## 2017-04-05 DIAGNOSIS — R1310 Dysphagia, unspecified: Secondary | ICD-10-CM | POA: Diagnosis not present

## 2017-04-05 DIAGNOSIS — G309 Alzheimer's disease, unspecified: Secondary | ICD-10-CM | POA: Diagnosis not present

## 2017-04-06 DIAGNOSIS — I10 Essential (primary) hypertension: Secondary | ICD-10-CM | POA: Diagnosis not present

## 2017-04-06 DIAGNOSIS — R1310 Dysphagia, unspecified: Secondary | ICD-10-CM | POA: Diagnosis not present

## 2017-04-06 DIAGNOSIS — K219 Gastro-esophageal reflux disease without esophagitis: Secondary | ICD-10-CM | POA: Diagnosis not present

## 2017-04-06 DIAGNOSIS — F0281 Dementia in other diseases classified elsewhere with behavioral disturbance: Secondary | ICD-10-CM | POA: Diagnosis not present

## 2017-04-06 DIAGNOSIS — I4891 Unspecified atrial fibrillation: Secondary | ICD-10-CM | POA: Diagnosis not present

## 2017-04-06 DIAGNOSIS — G309 Alzheimer's disease, unspecified: Secondary | ICD-10-CM | POA: Diagnosis not present

## 2017-04-10 DIAGNOSIS — I10 Essential (primary) hypertension: Secondary | ICD-10-CM | POA: Diagnosis not present

## 2017-04-10 DIAGNOSIS — R1310 Dysphagia, unspecified: Secondary | ICD-10-CM | POA: Diagnosis not present

## 2017-04-10 DIAGNOSIS — F0281 Dementia in other diseases classified elsewhere with behavioral disturbance: Secondary | ICD-10-CM | POA: Diagnosis not present

## 2017-04-10 DIAGNOSIS — K219 Gastro-esophageal reflux disease without esophagitis: Secondary | ICD-10-CM | POA: Diagnosis not present

## 2017-04-10 DIAGNOSIS — I4891 Unspecified atrial fibrillation: Secondary | ICD-10-CM | POA: Diagnosis not present

## 2017-04-10 DIAGNOSIS — G309 Alzheimer's disease, unspecified: Secondary | ICD-10-CM | POA: Diagnosis not present

## 2017-04-11 DIAGNOSIS — F0281 Dementia in other diseases classified elsewhere with behavioral disturbance: Secondary | ICD-10-CM | POA: Diagnosis not present

## 2017-04-11 DIAGNOSIS — G309 Alzheimer's disease, unspecified: Secondary | ICD-10-CM | POA: Diagnosis not present

## 2017-04-11 DIAGNOSIS — I10 Essential (primary) hypertension: Secondary | ICD-10-CM | POA: Diagnosis not present

## 2017-04-11 DIAGNOSIS — K219 Gastro-esophageal reflux disease without esophagitis: Secondary | ICD-10-CM | POA: Diagnosis not present

## 2017-04-11 DIAGNOSIS — I4891 Unspecified atrial fibrillation: Secondary | ICD-10-CM | POA: Diagnosis not present

## 2017-04-11 DIAGNOSIS — R1310 Dysphagia, unspecified: Secondary | ICD-10-CM | POA: Diagnosis not present

## 2017-04-12 DIAGNOSIS — I10 Essential (primary) hypertension: Secondary | ICD-10-CM | POA: Diagnosis not present

## 2017-04-12 DIAGNOSIS — I4891 Unspecified atrial fibrillation: Secondary | ICD-10-CM | POA: Diagnosis not present

## 2017-04-12 DIAGNOSIS — K219 Gastro-esophageal reflux disease without esophagitis: Secondary | ICD-10-CM | POA: Diagnosis not present

## 2017-04-12 DIAGNOSIS — F0281 Dementia in other diseases classified elsewhere with behavioral disturbance: Secondary | ICD-10-CM | POA: Diagnosis not present

## 2017-04-12 DIAGNOSIS — G309 Alzheimer's disease, unspecified: Secondary | ICD-10-CM | POA: Diagnosis not present

## 2017-04-12 DIAGNOSIS — R1310 Dysphagia, unspecified: Secondary | ICD-10-CM | POA: Diagnosis not present

## 2017-04-13 DIAGNOSIS — I4891 Unspecified atrial fibrillation: Secondary | ICD-10-CM | POA: Diagnosis not present

## 2017-04-13 DIAGNOSIS — I10 Essential (primary) hypertension: Secondary | ICD-10-CM | POA: Diagnosis not present

## 2017-04-13 DIAGNOSIS — F0281 Dementia in other diseases classified elsewhere with behavioral disturbance: Secondary | ICD-10-CM | POA: Diagnosis not present

## 2017-04-13 DIAGNOSIS — G309 Alzheimer's disease, unspecified: Secondary | ICD-10-CM | POA: Diagnosis not present

## 2017-04-13 DIAGNOSIS — K219 Gastro-esophageal reflux disease without esophagitis: Secondary | ICD-10-CM | POA: Diagnosis not present

## 2017-04-13 DIAGNOSIS — R1310 Dysphagia, unspecified: Secondary | ICD-10-CM | POA: Diagnosis not present

## 2017-04-17 DIAGNOSIS — G309 Alzheimer's disease, unspecified: Secondary | ICD-10-CM | POA: Diagnosis not present

## 2017-04-17 DIAGNOSIS — K219 Gastro-esophageal reflux disease without esophagitis: Secondary | ICD-10-CM | POA: Diagnosis not present

## 2017-04-17 DIAGNOSIS — F0281 Dementia in other diseases classified elsewhere with behavioral disturbance: Secondary | ICD-10-CM | POA: Diagnosis not present

## 2017-04-17 DIAGNOSIS — R1310 Dysphagia, unspecified: Secondary | ICD-10-CM | POA: Diagnosis not present

## 2017-04-17 DIAGNOSIS — I4891 Unspecified atrial fibrillation: Secondary | ICD-10-CM | POA: Diagnosis not present

## 2017-04-17 DIAGNOSIS — I10 Essential (primary) hypertension: Secondary | ICD-10-CM | POA: Diagnosis not present

## 2017-04-20 DIAGNOSIS — I4891 Unspecified atrial fibrillation: Secondary | ICD-10-CM | POA: Diagnosis not present

## 2017-04-20 DIAGNOSIS — H409 Unspecified glaucoma: Secondary | ICD-10-CM | POA: Diagnosis not present

## 2017-04-20 DIAGNOSIS — F0281 Dementia in other diseases classified elsewhere with behavioral disturbance: Secondary | ICD-10-CM | POA: Diagnosis not present

## 2017-04-20 DIAGNOSIS — I10 Essential (primary) hypertension: Secondary | ICD-10-CM | POA: Diagnosis not present

## 2017-04-20 DIAGNOSIS — E039 Hypothyroidism, unspecified: Secondary | ICD-10-CM | POA: Diagnosis not present

## 2017-04-20 DIAGNOSIS — G309 Alzheimer's disease, unspecified: Secondary | ICD-10-CM | POA: Diagnosis not present

## 2017-04-20 DIAGNOSIS — R1310 Dysphagia, unspecified: Secondary | ICD-10-CM | POA: Diagnosis not present

## 2017-04-20 DIAGNOSIS — K219 Gastro-esophageal reflux disease without esophagitis: Secondary | ICD-10-CM | POA: Diagnosis not present

## 2017-04-23 DIAGNOSIS — K219 Gastro-esophageal reflux disease without esophagitis: Secondary | ICD-10-CM | POA: Diagnosis not present

## 2017-04-23 DIAGNOSIS — G309 Alzheimer's disease, unspecified: Secondary | ICD-10-CM | POA: Diagnosis not present

## 2017-04-23 DIAGNOSIS — I10 Essential (primary) hypertension: Secondary | ICD-10-CM | POA: Diagnosis not present

## 2017-04-23 DIAGNOSIS — R1310 Dysphagia, unspecified: Secondary | ICD-10-CM | POA: Diagnosis not present

## 2017-04-23 DIAGNOSIS — I4891 Unspecified atrial fibrillation: Secondary | ICD-10-CM | POA: Diagnosis not present

## 2017-04-23 DIAGNOSIS — F0281 Dementia in other diseases classified elsewhere with behavioral disturbance: Secondary | ICD-10-CM | POA: Diagnosis not present

## 2017-04-24 DIAGNOSIS — G309 Alzheimer's disease, unspecified: Secondary | ICD-10-CM | POA: Diagnosis not present

## 2017-04-24 DIAGNOSIS — K219 Gastro-esophageal reflux disease without esophagitis: Secondary | ICD-10-CM | POA: Diagnosis not present

## 2017-04-24 DIAGNOSIS — I10 Essential (primary) hypertension: Secondary | ICD-10-CM | POA: Diagnosis not present

## 2017-04-24 DIAGNOSIS — I4891 Unspecified atrial fibrillation: Secondary | ICD-10-CM | POA: Diagnosis not present

## 2017-04-24 DIAGNOSIS — F0281 Dementia in other diseases classified elsewhere with behavioral disturbance: Secondary | ICD-10-CM | POA: Diagnosis not present

## 2017-04-24 DIAGNOSIS — R1310 Dysphagia, unspecified: Secondary | ICD-10-CM | POA: Diagnosis not present

## 2017-04-27 DIAGNOSIS — I10 Essential (primary) hypertension: Secondary | ICD-10-CM | POA: Diagnosis not present

## 2017-04-27 DIAGNOSIS — F0281 Dementia in other diseases classified elsewhere with behavioral disturbance: Secondary | ICD-10-CM | POA: Diagnosis not present

## 2017-04-27 DIAGNOSIS — K219 Gastro-esophageal reflux disease without esophagitis: Secondary | ICD-10-CM | POA: Diagnosis not present

## 2017-04-27 DIAGNOSIS — R1310 Dysphagia, unspecified: Secondary | ICD-10-CM | POA: Diagnosis not present

## 2017-04-27 DIAGNOSIS — G309 Alzheimer's disease, unspecified: Secondary | ICD-10-CM | POA: Diagnosis not present

## 2017-04-27 DIAGNOSIS — I4891 Unspecified atrial fibrillation: Secondary | ICD-10-CM | POA: Diagnosis not present

## 2017-04-30 DIAGNOSIS — R05 Cough: Secondary | ICD-10-CM | POA: Diagnosis not present

## 2017-04-30 DIAGNOSIS — I10 Essential (primary) hypertension: Secondary | ICD-10-CM | POA: Diagnosis not present

## 2017-04-30 DIAGNOSIS — K219 Gastro-esophageal reflux disease without esophagitis: Secondary | ICD-10-CM | POA: Diagnosis not present

## 2017-04-30 DIAGNOSIS — F0281 Dementia in other diseases classified elsewhere with behavioral disturbance: Secondary | ICD-10-CM | POA: Diagnosis not present

## 2017-04-30 DIAGNOSIS — R1310 Dysphagia, unspecified: Secondary | ICD-10-CM | POA: Diagnosis not present

## 2017-04-30 DIAGNOSIS — I4891 Unspecified atrial fibrillation: Secondary | ICD-10-CM | POA: Diagnosis not present

## 2017-04-30 DIAGNOSIS — G309 Alzheimer's disease, unspecified: Secondary | ICD-10-CM | POA: Diagnosis not present

## 2017-05-01 DIAGNOSIS — I4891 Unspecified atrial fibrillation: Secondary | ICD-10-CM | POA: Diagnosis not present

## 2017-05-01 DIAGNOSIS — F0281 Dementia in other diseases classified elsewhere with behavioral disturbance: Secondary | ICD-10-CM | POA: Diagnosis not present

## 2017-05-01 DIAGNOSIS — R1310 Dysphagia, unspecified: Secondary | ICD-10-CM | POA: Diagnosis not present

## 2017-05-01 DIAGNOSIS — K219 Gastro-esophageal reflux disease without esophagitis: Secondary | ICD-10-CM | POA: Diagnosis not present

## 2017-05-01 DIAGNOSIS — I10 Essential (primary) hypertension: Secondary | ICD-10-CM | POA: Diagnosis not present

## 2017-05-01 DIAGNOSIS — G309 Alzheimer's disease, unspecified: Secondary | ICD-10-CM | POA: Diagnosis not present

## 2017-05-04 DIAGNOSIS — G309 Alzheimer's disease, unspecified: Secondary | ICD-10-CM | POA: Diagnosis not present

## 2017-05-04 DIAGNOSIS — I4891 Unspecified atrial fibrillation: Secondary | ICD-10-CM | POA: Diagnosis not present

## 2017-05-04 DIAGNOSIS — I10 Essential (primary) hypertension: Secondary | ICD-10-CM | POA: Diagnosis not present

## 2017-05-04 DIAGNOSIS — F0281 Dementia in other diseases classified elsewhere with behavioral disturbance: Secondary | ICD-10-CM | POA: Diagnosis not present

## 2017-05-04 DIAGNOSIS — K219 Gastro-esophageal reflux disease without esophagitis: Secondary | ICD-10-CM | POA: Diagnosis not present

## 2017-05-04 DIAGNOSIS — R1310 Dysphagia, unspecified: Secondary | ICD-10-CM | POA: Diagnosis not present

## 2017-05-08 DIAGNOSIS — I10 Essential (primary) hypertension: Secondary | ICD-10-CM | POA: Diagnosis not present

## 2017-05-08 DIAGNOSIS — F0281 Dementia in other diseases classified elsewhere with behavioral disturbance: Secondary | ICD-10-CM | POA: Diagnosis not present

## 2017-05-08 DIAGNOSIS — K219 Gastro-esophageal reflux disease without esophagitis: Secondary | ICD-10-CM | POA: Diagnosis not present

## 2017-05-08 DIAGNOSIS — I4891 Unspecified atrial fibrillation: Secondary | ICD-10-CM | POA: Diagnosis not present

## 2017-05-08 DIAGNOSIS — R1310 Dysphagia, unspecified: Secondary | ICD-10-CM | POA: Diagnosis not present

## 2017-05-08 DIAGNOSIS — G309 Alzheimer's disease, unspecified: Secondary | ICD-10-CM | POA: Diagnosis not present

## 2017-05-11 DIAGNOSIS — F0281 Dementia in other diseases classified elsewhere with behavioral disturbance: Secondary | ICD-10-CM | POA: Diagnosis not present

## 2017-05-11 DIAGNOSIS — R1310 Dysphagia, unspecified: Secondary | ICD-10-CM | POA: Diagnosis not present

## 2017-05-11 DIAGNOSIS — I4891 Unspecified atrial fibrillation: Secondary | ICD-10-CM | POA: Diagnosis not present

## 2017-05-11 DIAGNOSIS — K219 Gastro-esophageal reflux disease without esophagitis: Secondary | ICD-10-CM | POA: Diagnosis not present

## 2017-05-11 DIAGNOSIS — I10 Essential (primary) hypertension: Secondary | ICD-10-CM | POA: Diagnosis not present

## 2017-05-11 DIAGNOSIS — G309 Alzheimer's disease, unspecified: Secondary | ICD-10-CM | POA: Diagnosis not present

## 2017-05-14 DIAGNOSIS — B351 Tinea unguium: Secondary | ICD-10-CM | POA: Diagnosis not present

## 2017-05-14 DIAGNOSIS — I739 Peripheral vascular disease, unspecified: Secondary | ICD-10-CM | POA: Diagnosis not present

## 2017-05-14 DIAGNOSIS — Q845 Enlarged and hypertrophic nails: Secondary | ICD-10-CM | POA: Diagnosis not present

## 2017-05-14 DIAGNOSIS — L603 Nail dystrophy: Secondary | ICD-10-CM | POA: Diagnosis not present

## 2017-05-15 DIAGNOSIS — G309 Alzheimer's disease, unspecified: Secondary | ICD-10-CM | POA: Diagnosis not present

## 2017-05-15 DIAGNOSIS — R1310 Dysphagia, unspecified: Secondary | ICD-10-CM | POA: Diagnosis not present

## 2017-05-15 DIAGNOSIS — I10 Essential (primary) hypertension: Secondary | ICD-10-CM | POA: Diagnosis not present

## 2017-05-15 DIAGNOSIS — F0281 Dementia in other diseases classified elsewhere with behavioral disturbance: Secondary | ICD-10-CM | POA: Diagnosis not present

## 2017-05-15 DIAGNOSIS — K219 Gastro-esophageal reflux disease without esophagitis: Secondary | ICD-10-CM | POA: Diagnosis not present

## 2017-05-15 DIAGNOSIS — I4891 Unspecified atrial fibrillation: Secondary | ICD-10-CM | POA: Diagnosis not present

## 2017-05-18 DIAGNOSIS — F0281 Dementia in other diseases classified elsewhere with behavioral disturbance: Secondary | ICD-10-CM | POA: Diagnosis not present

## 2017-05-18 DIAGNOSIS — G309 Alzheimer's disease, unspecified: Secondary | ICD-10-CM | POA: Diagnosis not present

## 2017-05-18 DIAGNOSIS — E039 Hypothyroidism, unspecified: Secondary | ICD-10-CM | POA: Diagnosis not present

## 2017-05-18 DIAGNOSIS — K219 Gastro-esophageal reflux disease without esophagitis: Secondary | ICD-10-CM | POA: Diagnosis not present

## 2017-05-18 DIAGNOSIS — H409 Unspecified glaucoma: Secondary | ICD-10-CM | POA: Diagnosis not present

## 2017-05-18 DIAGNOSIS — I4891 Unspecified atrial fibrillation: Secondary | ICD-10-CM | POA: Diagnosis not present

## 2017-05-18 DIAGNOSIS — I10 Essential (primary) hypertension: Secondary | ICD-10-CM | POA: Diagnosis not present

## 2017-05-18 DIAGNOSIS — R1310 Dysphagia, unspecified: Secondary | ICD-10-CM | POA: Diagnosis not present

## 2017-05-21 DIAGNOSIS — K219 Gastro-esophageal reflux disease without esophagitis: Secondary | ICD-10-CM | POA: Diagnosis not present

## 2017-05-21 DIAGNOSIS — I10 Essential (primary) hypertension: Secondary | ICD-10-CM | POA: Diagnosis not present

## 2017-05-21 DIAGNOSIS — R1310 Dysphagia, unspecified: Secondary | ICD-10-CM | POA: Diagnosis not present

## 2017-05-21 DIAGNOSIS — I4891 Unspecified atrial fibrillation: Secondary | ICD-10-CM | POA: Diagnosis not present

## 2017-05-21 DIAGNOSIS — F0281 Dementia in other diseases classified elsewhere with behavioral disturbance: Secondary | ICD-10-CM | POA: Diagnosis not present

## 2017-05-21 DIAGNOSIS — G309 Alzheimer's disease, unspecified: Secondary | ICD-10-CM | POA: Diagnosis not present

## 2017-05-22 DIAGNOSIS — K219 Gastro-esophageal reflux disease without esophagitis: Secondary | ICD-10-CM | POA: Diagnosis not present

## 2017-05-22 DIAGNOSIS — I10 Essential (primary) hypertension: Secondary | ICD-10-CM | POA: Diagnosis not present

## 2017-05-22 DIAGNOSIS — I4891 Unspecified atrial fibrillation: Secondary | ICD-10-CM | POA: Diagnosis not present

## 2017-05-22 DIAGNOSIS — F0281 Dementia in other diseases classified elsewhere with behavioral disturbance: Secondary | ICD-10-CM | POA: Diagnosis not present

## 2017-05-22 DIAGNOSIS — G309 Alzheimer's disease, unspecified: Secondary | ICD-10-CM | POA: Diagnosis not present

## 2017-05-22 DIAGNOSIS — R1310 Dysphagia, unspecified: Secondary | ICD-10-CM | POA: Diagnosis not present

## 2017-05-25 DIAGNOSIS — F0281 Dementia in other diseases classified elsewhere with behavioral disturbance: Secondary | ICD-10-CM | POA: Diagnosis not present

## 2017-05-25 DIAGNOSIS — G309 Alzheimer's disease, unspecified: Secondary | ICD-10-CM | POA: Diagnosis not present

## 2017-05-25 DIAGNOSIS — I10 Essential (primary) hypertension: Secondary | ICD-10-CM | POA: Diagnosis not present

## 2017-05-25 DIAGNOSIS — I4891 Unspecified atrial fibrillation: Secondary | ICD-10-CM | POA: Diagnosis not present

## 2017-05-25 DIAGNOSIS — K219 Gastro-esophageal reflux disease without esophagitis: Secondary | ICD-10-CM | POA: Diagnosis not present

## 2017-05-25 DIAGNOSIS — R1310 Dysphagia, unspecified: Secondary | ICD-10-CM | POA: Diagnosis not present

## 2017-05-28 DIAGNOSIS — F0281 Dementia in other diseases classified elsewhere with behavioral disturbance: Secondary | ICD-10-CM | POA: Diagnosis not present

## 2017-05-28 DIAGNOSIS — K219 Gastro-esophageal reflux disease without esophagitis: Secondary | ICD-10-CM | POA: Diagnosis not present

## 2017-05-28 DIAGNOSIS — R1310 Dysphagia, unspecified: Secondary | ICD-10-CM | POA: Diagnosis not present

## 2017-05-28 DIAGNOSIS — I10 Essential (primary) hypertension: Secondary | ICD-10-CM | POA: Diagnosis not present

## 2017-05-28 DIAGNOSIS — I4891 Unspecified atrial fibrillation: Secondary | ICD-10-CM | POA: Diagnosis not present

## 2017-05-28 DIAGNOSIS — G309 Alzheimer's disease, unspecified: Secondary | ICD-10-CM | POA: Diagnosis not present

## 2017-05-29 DIAGNOSIS — I4891 Unspecified atrial fibrillation: Secondary | ICD-10-CM | POA: Diagnosis not present

## 2017-05-29 DIAGNOSIS — R1310 Dysphagia, unspecified: Secondary | ICD-10-CM | POA: Diagnosis not present

## 2017-05-29 DIAGNOSIS — F0281 Dementia in other diseases classified elsewhere with behavioral disturbance: Secondary | ICD-10-CM | POA: Diagnosis not present

## 2017-05-29 DIAGNOSIS — I10 Essential (primary) hypertension: Secondary | ICD-10-CM | POA: Diagnosis not present

## 2017-05-29 DIAGNOSIS — G309 Alzheimer's disease, unspecified: Secondary | ICD-10-CM | POA: Diagnosis not present

## 2017-05-29 DIAGNOSIS — K219 Gastro-esophageal reflux disease without esophagitis: Secondary | ICD-10-CM | POA: Diagnosis not present

## 2017-06-01 DIAGNOSIS — R1310 Dysphagia, unspecified: Secondary | ICD-10-CM | POA: Diagnosis not present

## 2017-06-01 DIAGNOSIS — G309 Alzheimer's disease, unspecified: Secondary | ICD-10-CM | POA: Diagnosis not present

## 2017-06-01 DIAGNOSIS — K219 Gastro-esophageal reflux disease without esophagitis: Secondary | ICD-10-CM | POA: Diagnosis not present

## 2017-06-01 DIAGNOSIS — I10 Essential (primary) hypertension: Secondary | ICD-10-CM | POA: Diagnosis not present

## 2017-06-01 DIAGNOSIS — F0281 Dementia in other diseases classified elsewhere with behavioral disturbance: Secondary | ICD-10-CM | POA: Diagnosis not present

## 2017-06-01 DIAGNOSIS — I4891 Unspecified atrial fibrillation: Secondary | ICD-10-CM | POA: Diagnosis not present

## 2017-06-05 DIAGNOSIS — I4891 Unspecified atrial fibrillation: Secondary | ICD-10-CM | POA: Diagnosis not present

## 2017-06-05 DIAGNOSIS — R1310 Dysphagia, unspecified: Secondary | ICD-10-CM | POA: Diagnosis not present

## 2017-06-05 DIAGNOSIS — G309 Alzheimer's disease, unspecified: Secondary | ICD-10-CM | POA: Diagnosis not present

## 2017-06-05 DIAGNOSIS — K219 Gastro-esophageal reflux disease without esophagitis: Secondary | ICD-10-CM | POA: Diagnosis not present

## 2017-06-05 DIAGNOSIS — I10 Essential (primary) hypertension: Secondary | ICD-10-CM | POA: Diagnosis not present

## 2017-06-05 DIAGNOSIS — F0281 Dementia in other diseases classified elsewhere with behavioral disturbance: Secondary | ICD-10-CM | POA: Diagnosis not present

## 2017-06-06 DIAGNOSIS — I4891 Unspecified atrial fibrillation: Secondary | ICD-10-CM | POA: Diagnosis not present

## 2017-06-06 DIAGNOSIS — R1310 Dysphagia, unspecified: Secondary | ICD-10-CM | POA: Diagnosis not present

## 2017-06-06 DIAGNOSIS — F0281 Dementia in other diseases classified elsewhere with behavioral disturbance: Secondary | ICD-10-CM | POA: Diagnosis not present

## 2017-06-06 DIAGNOSIS — K219 Gastro-esophageal reflux disease without esophagitis: Secondary | ICD-10-CM | POA: Diagnosis not present

## 2017-06-06 DIAGNOSIS — I10 Essential (primary) hypertension: Secondary | ICD-10-CM | POA: Diagnosis not present

## 2017-06-06 DIAGNOSIS — G309 Alzheimer's disease, unspecified: Secondary | ICD-10-CM | POA: Diagnosis not present

## 2017-06-07 DIAGNOSIS — F0281 Dementia in other diseases classified elsewhere with behavioral disturbance: Secondary | ICD-10-CM | POA: Diagnosis not present

## 2017-06-07 DIAGNOSIS — R1310 Dysphagia, unspecified: Secondary | ICD-10-CM | POA: Diagnosis not present

## 2017-06-07 DIAGNOSIS — G309 Alzheimer's disease, unspecified: Secondary | ICD-10-CM | POA: Diagnosis not present

## 2017-06-07 DIAGNOSIS — I10 Essential (primary) hypertension: Secondary | ICD-10-CM | POA: Diagnosis not present

## 2017-06-07 DIAGNOSIS — I4891 Unspecified atrial fibrillation: Secondary | ICD-10-CM | POA: Diagnosis not present

## 2017-06-07 DIAGNOSIS — K219 Gastro-esophageal reflux disease without esophagitis: Secondary | ICD-10-CM | POA: Diagnosis not present

## 2017-06-08 DIAGNOSIS — F0281 Dementia in other diseases classified elsewhere with behavioral disturbance: Secondary | ICD-10-CM | POA: Diagnosis not present

## 2017-06-08 DIAGNOSIS — I10 Essential (primary) hypertension: Secondary | ICD-10-CM | POA: Diagnosis not present

## 2017-06-08 DIAGNOSIS — R1310 Dysphagia, unspecified: Secondary | ICD-10-CM | POA: Diagnosis not present

## 2017-06-08 DIAGNOSIS — G309 Alzheimer's disease, unspecified: Secondary | ICD-10-CM | POA: Diagnosis not present

## 2017-06-08 DIAGNOSIS — K219 Gastro-esophageal reflux disease without esophagitis: Secondary | ICD-10-CM | POA: Diagnosis not present

## 2017-06-08 DIAGNOSIS — I4891 Unspecified atrial fibrillation: Secondary | ICD-10-CM | POA: Diagnosis not present

## 2017-06-12 DIAGNOSIS — R1310 Dysphagia, unspecified: Secondary | ICD-10-CM | POA: Diagnosis not present

## 2017-06-12 DIAGNOSIS — I4891 Unspecified atrial fibrillation: Secondary | ICD-10-CM | POA: Diagnosis not present

## 2017-06-12 DIAGNOSIS — F0281 Dementia in other diseases classified elsewhere with behavioral disturbance: Secondary | ICD-10-CM | POA: Diagnosis not present

## 2017-06-12 DIAGNOSIS — K219 Gastro-esophageal reflux disease without esophagitis: Secondary | ICD-10-CM | POA: Diagnosis not present

## 2017-06-12 DIAGNOSIS — I10 Essential (primary) hypertension: Secondary | ICD-10-CM | POA: Diagnosis not present

## 2017-06-12 DIAGNOSIS — G309 Alzheimer's disease, unspecified: Secondary | ICD-10-CM | POA: Diagnosis not present

## 2017-06-13 DIAGNOSIS — F0281 Dementia in other diseases classified elsewhere with behavioral disturbance: Secondary | ICD-10-CM | POA: Diagnosis not present

## 2017-06-13 DIAGNOSIS — I10 Essential (primary) hypertension: Secondary | ICD-10-CM | POA: Diagnosis not present

## 2017-06-13 DIAGNOSIS — R1310 Dysphagia, unspecified: Secondary | ICD-10-CM | POA: Diagnosis not present

## 2017-06-13 DIAGNOSIS — I4891 Unspecified atrial fibrillation: Secondary | ICD-10-CM | POA: Diagnosis not present

## 2017-06-13 DIAGNOSIS — G309 Alzheimer's disease, unspecified: Secondary | ICD-10-CM | POA: Diagnosis not present

## 2017-06-13 DIAGNOSIS — K219 Gastro-esophageal reflux disease without esophagitis: Secondary | ICD-10-CM | POA: Diagnosis not present

## 2017-06-14 DIAGNOSIS — K219 Gastro-esophageal reflux disease without esophagitis: Secondary | ICD-10-CM | POA: Diagnosis not present

## 2017-06-14 DIAGNOSIS — I4891 Unspecified atrial fibrillation: Secondary | ICD-10-CM | POA: Diagnosis not present

## 2017-06-14 DIAGNOSIS — G309 Alzheimer's disease, unspecified: Secondary | ICD-10-CM | POA: Diagnosis not present

## 2017-06-14 DIAGNOSIS — R1310 Dysphagia, unspecified: Secondary | ICD-10-CM | POA: Diagnosis not present

## 2017-06-14 DIAGNOSIS — F0281 Dementia in other diseases classified elsewhere with behavioral disturbance: Secondary | ICD-10-CM | POA: Diagnosis not present

## 2017-06-14 DIAGNOSIS — I10 Essential (primary) hypertension: Secondary | ICD-10-CM | POA: Diagnosis not present

## 2017-06-15 DIAGNOSIS — G309 Alzheimer's disease, unspecified: Secondary | ICD-10-CM | POA: Diagnosis not present

## 2017-06-15 DIAGNOSIS — I4891 Unspecified atrial fibrillation: Secondary | ICD-10-CM | POA: Diagnosis not present

## 2017-06-15 DIAGNOSIS — I10 Essential (primary) hypertension: Secondary | ICD-10-CM | POA: Diagnosis not present

## 2017-06-15 DIAGNOSIS — K219 Gastro-esophageal reflux disease without esophagitis: Secondary | ICD-10-CM | POA: Diagnosis not present

## 2017-06-15 DIAGNOSIS — F0281 Dementia in other diseases classified elsewhere with behavioral disturbance: Secondary | ICD-10-CM | POA: Diagnosis not present

## 2017-06-15 DIAGNOSIS — R1310 Dysphagia, unspecified: Secondary | ICD-10-CM | POA: Diagnosis not present

## 2017-06-16 DIAGNOSIS — I10 Essential (primary) hypertension: Secondary | ICD-10-CM | POA: Diagnosis not present

## 2017-06-16 DIAGNOSIS — F0281 Dementia in other diseases classified elsewhere with behavioral disturbance: Secondary | ICD-10-CM | POA: Diagnosis not present

## 2017-06-16 DIAGNOSIS — K219 Gastro-esophageal reflux disease without esophagitis: Secondary | ICD-10-CM | POA: Diagnosis not present

## 2017-06-16 DIAGNOSIS — R1310 Dysphagia, unspecified: Secondary | ICD-10-CM | POA: Diagnosis not present

## 2017-06-16 DIAGNOSIS — G309 Alzheimer's disease, unspecified: Secondary | ICD-10-CM | POA: Diagnosis not present

## 2017-06-16 DIAGNOSIS — I4891 Unspecified atrial fibrillation: Secondary | ICD-10-CM | POA: Diagnosis not present

## 2017-06-17 DIAGNOSIS — G309 Alzheimer's disease, unspecified: Secondary | ICD-10-CM | POA: Diagnosis not present

## 2017-06-17 DIAGNOSIS — F0281 Dementia in other diseases classified elsewhere with behavioral disturbance: Secondary | ICD-10-CM | POA: Diagnosis not present

## 2017-06-17 DIAGNOSIS — I4891 Unspecified atrial fibrillation: Secondary | ICD-10-CM | POA: Diagnosis not present

## 2017-06-17 DIAGNOSIS — K219 Gastro-esophageal reflux disease without esophagitis: Secondary | ICD-10-CM | POA: Diagnosis not present

## 2017-06-17 DIAGNOSIS — R1310 Dysphagia, unspecified: Secondary | ICD-10-CM | POA: Diagnosis not present

## 2017-06-17 DIAGNOSIS — I10 Essential (primary) hypertension: Secondary | ICD-10-CM | POA: Diagnosis not present

## 2017-06-18 DIAGNOSIS — K219 Gastro-esophageal reflux disease without esophagitis: Secondary | ICD-10-CM | POA: Diagnosis not present

## 2017-06-18 DIAGNOSIS — R1310 Dysphagia, unspecified: Secondary | ICD-10-CM | POA: Diagnosis not present

## 2017-06-18 DIAGNOSIS — E039 Hypothyroidism, unspecified: Secondary | ICD-10-CM | POA: Diagnosis not present

## 2017-06-18 DIAGNOSIS — I10 Essential (primary) hypertension: Secondary | ICD-10-CM | POA: Diagnosis not present

## 2017-06-18 DIAGNOSIS — H409 Unspecified glaucoma: Secondary | ICD-10-CM | POA: Diagnosis not present

## 2017-06-18 DIAGNOSIS — I4891 Unspecified atrial fibrillation: Secondary | ICD-10-CM | POA: Diagnosis not present

## 2017-06-18 DIAGNOSIS — F0281 Dementia in other diseases classified elsewhere with behavioral disturbance: Secondary | ICD-10-CM | POA: Diagnosis not present

## 2017-06-18 DIAGNOSIS — G309 Alzheimer's disease, unspecified: Secondary | ICD-10-CM | POA: Diagnosis not present

## 2017-06-19 DIAGNOSIS — I4891 Unspecified atrial fibrillation: Secondary | ICD-10-CM | POA: Diagnosis not present

## 2017-06-19 DIAGNOSIS — R1310 Dysphagia, unspecified: Secondary | ICD-10-CM | POA: Diagnosis not present

## 2017-06-19 DIAGNOSIS — I10 Essential (primary) hypertension: Secondary | ICD-10-CM | POA: Diagnosis not present

## 2017-06-19 DIAGNOSIS — F0281 Dementia in other diseases classified elsewhere with behavioral disturbance: Secondary | ICD-10-CM | POA: Diagnosis not present

## 2017-06-19 DIAGNOSIS — G309 Alzheimer's disease, unspecified: Secondary | ICD-10-CM | POA: Diagnosis not present

## 2017-06-19 DIAGNOSIS — K219 Gastro-esophageal reflux disease without esophagitis: Secondary | ICD-10-CM | POA: Diagnosis not present

## 2017-07-18 DEATH — deceased

## 2018-07-24 IMAGING — CT CT HEAD W/O CM
3 of 7 series · 15 of 47 positions shown, 18 images · non-contrast
Comparison: 06/17/2014

CLINICAL DATA: Unwitnessed fall with laceration to the right head

EXAM:
CT HEAD WITHOUT CONTRAST
CT CERVICAL SPINE WITHOUT CONTRAST
TECHNIQUE: Multidetector CT imaging of the head and cervical spine was
performed following the standard protocol without intravenous
contrast. Multiplanar CT image reconstructions of the cervical spine
were also generated.

[Series 6: head 3.0 mpr cor · coronal · 0.33mm/px · 3 of 70 slices shown]
[im 3/70  brain]
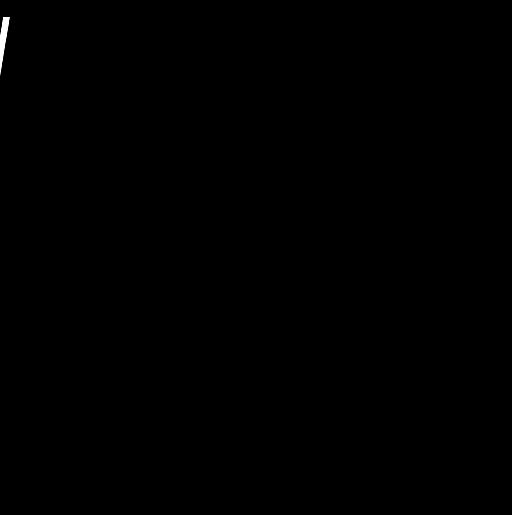
[im 6/70  brain]
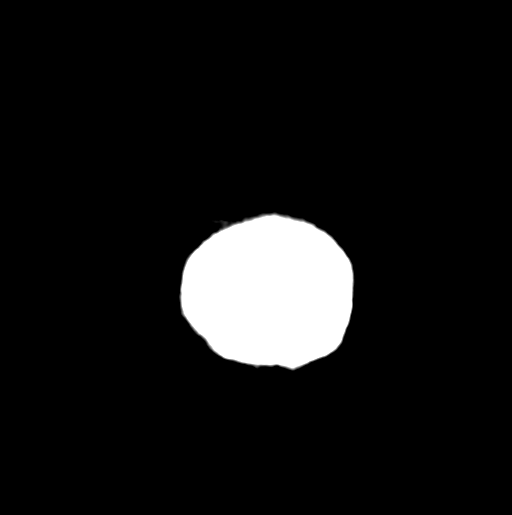
[im 9/70  brain]
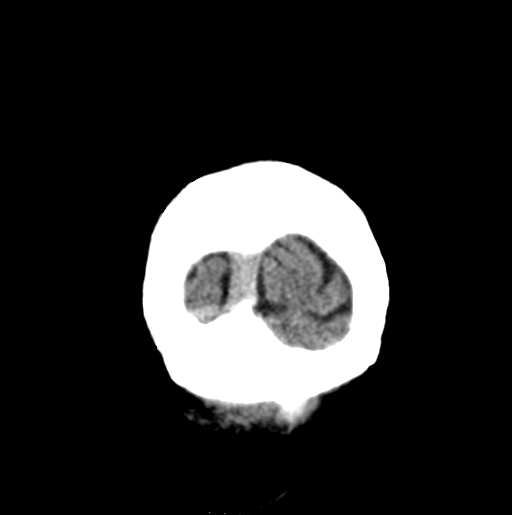

[Series 7: head 3.0 mpr sag · sagittal · 0.31mm/px · 1 of 53 slices shown]
[im 27/53  brain]
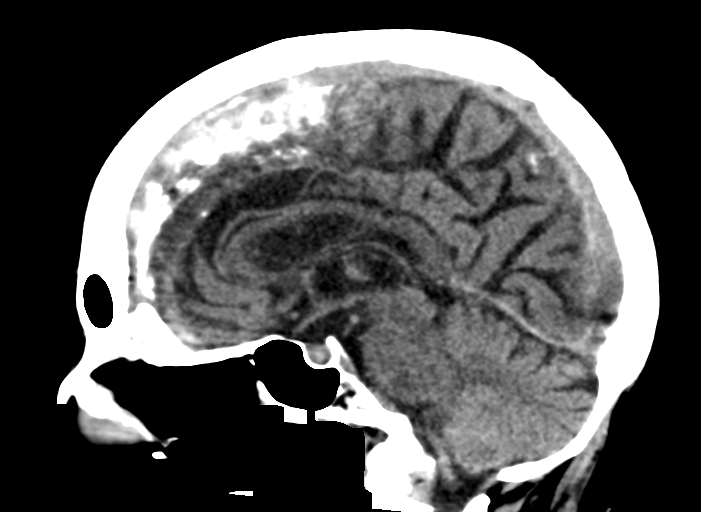

[Series 13: orthogonal axial st · axial · 0.21mm/px · z∈[-293,-146]mm · 11 of 83 slices shown, 14 images]
[im 7/83  brain]
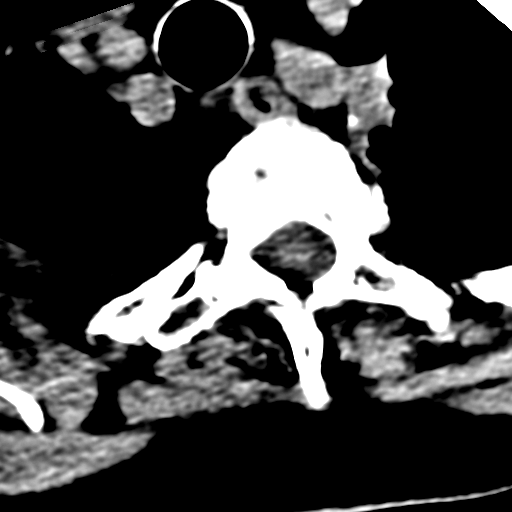
[im 7/83  bone]
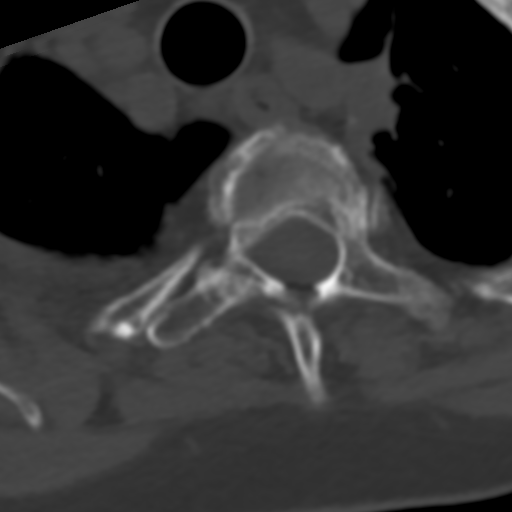
[im 14/83  brain]
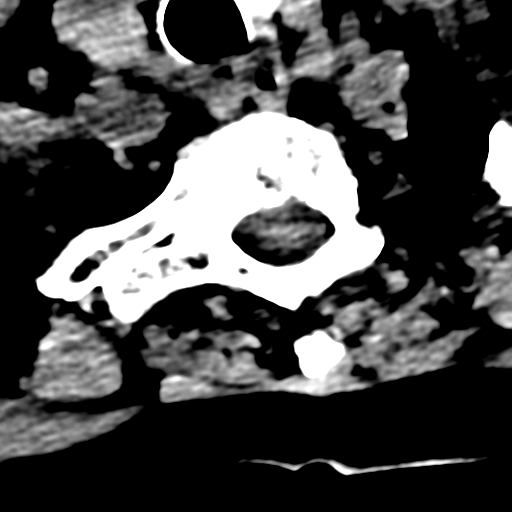
[im 21/83  brain]
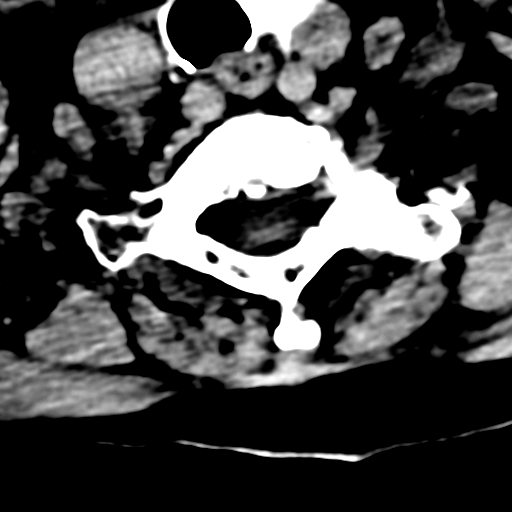
[im 28/83  brain]
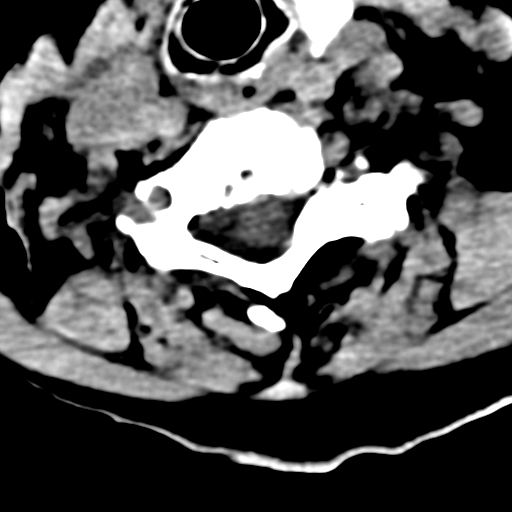
[im 35/83  brain]
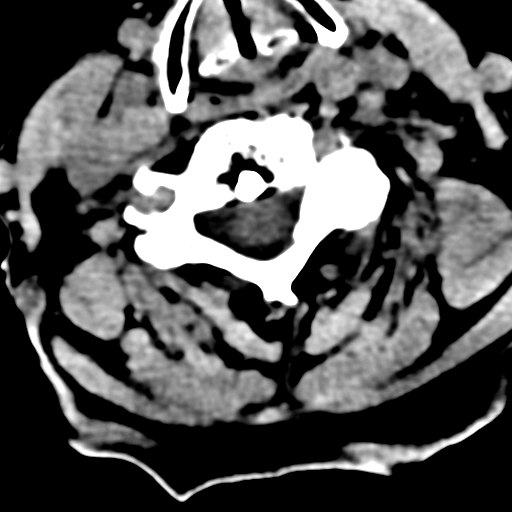
[im 35/83  bone]
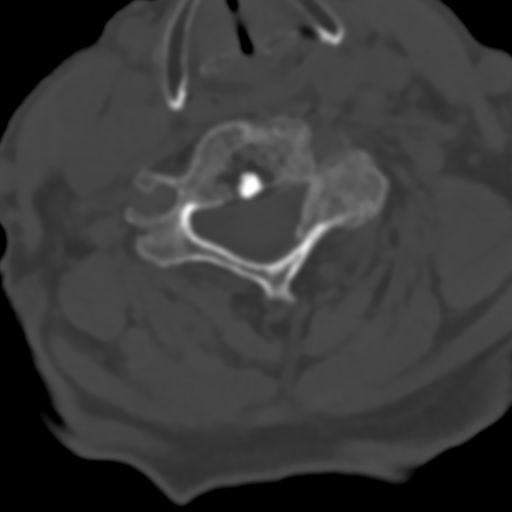
[im 42/83  brain]
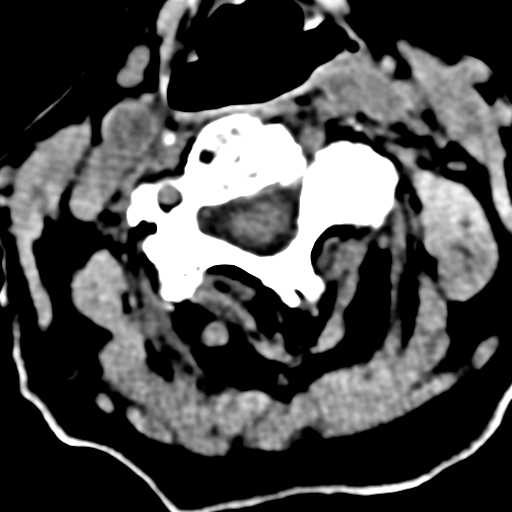
[im 48/83  brain]
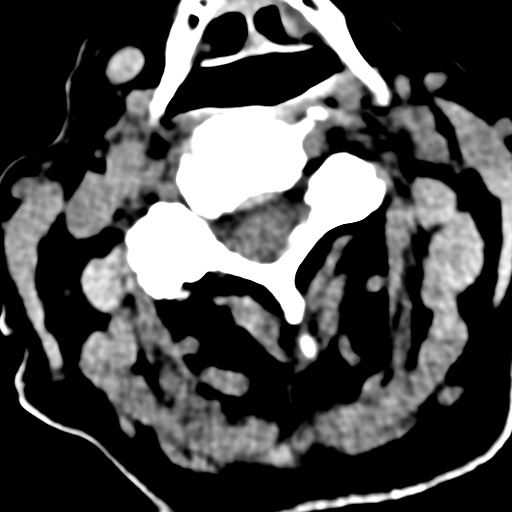
[im 55/83  brain]
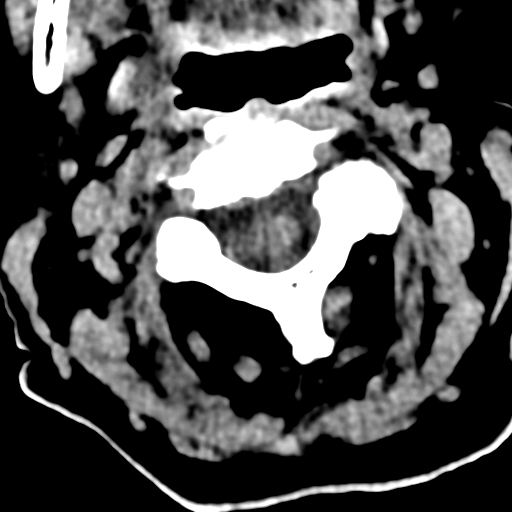
[im 62/83  brain]
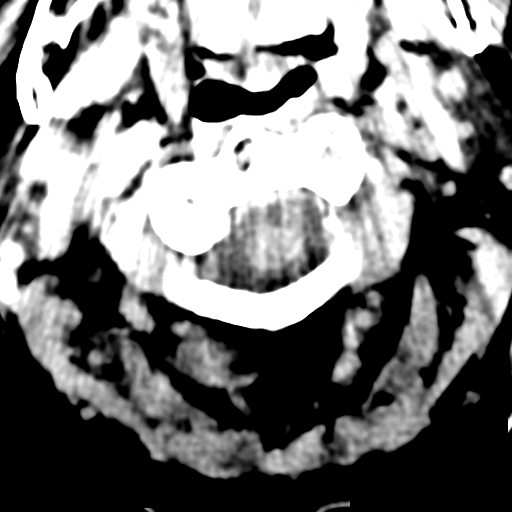
[im 62/83  bone]
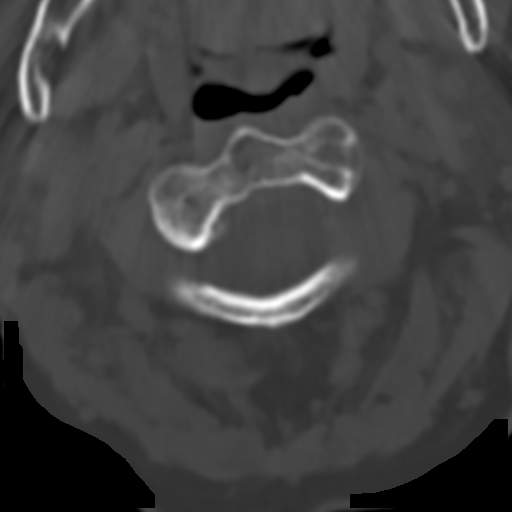
[im 69/83  brain]
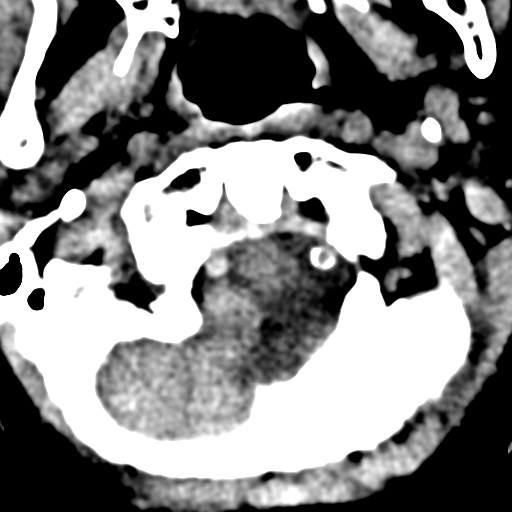
[im 76/83  brain]
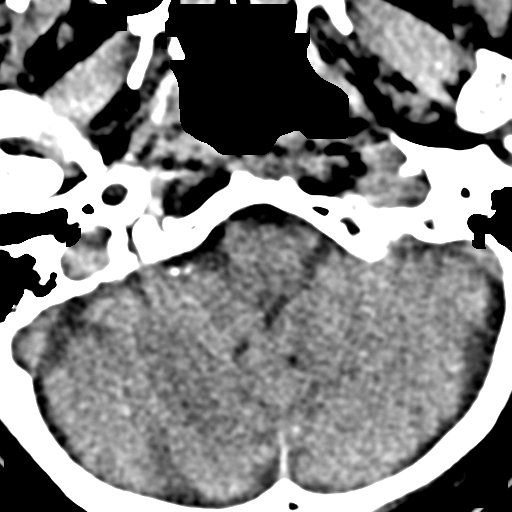

[15 of 47 positions shown; findings below may reference images not displayed]

FINDINGS: CT HEAD FINDINGS

Brain: No acute territorial infarction, hemorrhage or intracranial
mass is visualized. Marked atrophy. Moderate small vessel ischemic
changes of the white matter. Old infarct in the right frontal lobe
white matter. Stable ventricle size.

Vascular: No hyperdense vessels.  Carotid artery calcification.

Skull: No depressed skull fracture.  Prominent vascular channels.

Sinuses/Orbits: Mild mucosal thickening in the ethmoid and left
sphenoid sinus which is hypoplastic. No acute orbital abnormality.

Other: Large right frontal scalp hematoma.

CT CERVICAL SPINE FINDINGS

Alignment: Trace anterolisthesis of C4 on C5 as before. Facet
alignment is within normal limits.

Skull base and vertebrae: Craniovertebral junction is intact. No
fracture. Stable sclerosis posterior C5.

Soft tissues and spinal canal: No prevertebral fluid or swelling. No
visible canal hematoma.

Disc levels: Marked degenerative changes C4 through C6 with partial
bony fusion present. Mild degenerative changes elsewhere in the
spine. Multiple level facet hypertrophic arthropathy with bilateral
foraminal stenosis.

Upper chest: Lung apices show scarring at the right apex. Probable
right lobectomy of the thyroid. Small hypodense subcentimeter in
left lobe thyroid nodules.

Other: None
IMPRESSION: 1. No CT evidence for acute intracranial abnormality. Atrophy and
small vessel ischemic changes of the white matter
2. Large right frontal scalp hematoma
3. Degenerative changes of the cervical spine. No acute osseous
abnormality.
